# Patient Record
Sex: Female | Born: 1995
Health system: Southern US, Community
[De-identification: ages and names within clinical notes are randomized; demographics above are authoritative.]

## PROBLEM LIST (undated history)

## (undated) DIAGNOSIS — J302 Other seasonal allergic rhinitis: Secondary | ICD-10-CM

## (undated) DIAGNOSIS — L309 Dermatitis, unspecified: Secondary | ICD-10-CM

## (undated) HISTORY — DX: Dermatitis, unspecified: L30.9

## (undated) HISTORY — DX: Other seasonal allergic rhinitis: J30.2

---

## 2013-04-08 HISTORY — PX: CYST EXCISION: SHX5701

## 2017-03-13 DIAGNOSIS — B373 Candidiasis of vulva and vagina: Secondary | ICD-10-CM | POA: Diagnosis not present

## 2017-03-13 DIAGNOSIS — Z30011 Encounter for initial prescription of contraceptive pills: Secondary | ICD-10-CM | POA: Diagnosis not present

## 2017-05-06 DIAGNOSIS — L700 Acne vulgaris: Secondary | ICD-10-CM | POA: Diagnosis not present

## 2017-05-06 DIAGNOSIS — L818 Other specified disorders of pigmentation: Secondary | ICD-10-CM | POA: Diagnosis not present

## 2019-02-09 ENCOUNTER — Encounter: Payer: Self-pay | Admitting: Family

## 2019-02-09 ENCOUNTER — Ambulatory Visit (INDEPENDENT_AMBULATORY_CARE_PROVIDER_SITE_OTHER): Payer: BC Managed Care – PPO | Admitting: Family

## 2019-02-09 ENCOUNTER — Other Ambulatory Visit: Payer: Self-pay

## 2019-02-09 VITALS — BP 130/71 | HR 74 | Temp 97.7°F | Resp 16 | Ht 64.0 in | Wt 196.0 lb

## 2019-02-09 DIAGNOSIS — L309 Dermatitis, unspecified: Secondary | ICD-10-CM

## 2019-02-09 DIAGNOSIS — Z0000 Encounter for general adult medical examination without abnormal findings: Secondary | ICD-10-CM

## 2019-02-09 DIAGNOSIS — Z23 Encounter for immunization: Secondary | ICD-10-CM

## 2019-02-09 NOTE — Patient Instructions (Addendum)
Please complete lab work prior to leaving.  Preventive Care 21-25 Years Old, Female Preventive care refers to visits with your health care provider and lifestyle choices that can promote health and wellness. This includes:  A yearly physical exam. This may also be called an annual well check.  Regular dental visits and eye exams.  Immunizations.  Screening for certain conditions.  Healthy lifestyle choices, such as eating a healthy diet, getting regular exercise, not using drugs or products that contain nicotine and tobacco, and limiting alcohol use. What can I expect for my preventive care visit? Physical exam Your health care provider will check your:  Height and weight. This may be used to calculate body mass index (BMI), which tells if you are at a healthy weight.  Heart rate and blood pressure.  Skin for abnormal spots. Counseling Your health care provider may ask you questions about your:  Alcohol, tobacco, and drug use.  Emotional well-being.  Home and relationship well-being.  Sexual activity.  Eating habits.  Work and work environment.  Method of birth control.  Menstrual cycle.  Pregnancy history. What immunizations do I need?  Influenza (flu) vaccine  This is recommended every year. Tetanus, diphtheria, and pertussis (Tdap) vaccine  You may need a Td booster every 10 years. Varicella (chickenpox) vaccine  You may need this if you have not been vaccinated. Human papillomavirus (HPV) vaccine  If recommended by your health care provider, you may need three doses over 6 months. Measles, mumps, and rubella (MMR) vaccine  You may need at least one dose of MMR. You may also need a second dose. Meningococcal conjugate (MenACWY) vaccine  One dose is recommended if you are age 19-21 years and a first-year college student living in a residence hall, or if you have one of several medical conditions. You may also need additional booster doses. Pneumococcal  conjugate (PCV13) vaccine  You may need this if you have certain conditions and were not previously vaccinated. Pneumococcal polysaccharide (PPSV23) vaccine  You may need one or two doses if you smoke cigarettes or if you have certain conditions. Hepatitis A vaccine  You may need this if you have certain conditions or if you travel or work in places where you may be exposed to hepatitis A. Hepatitis B vaccine  You may need this if you have certain conditions or if you travel or work in places where you may be exposed to hepatitis B. Haemophilus influenzae type b (Hib) vaccine  You may need this if you have certain conditions. You may receive vaccines as individual doses or as more than one vaccine together in one shot (combination vaccines). Talk with your health care provider about the risks and benefits of combination vaccines. What tests do I need?  Blood tests  Lipid and cholesterol levels. These may be checked every 5 years starting at age 20.  Hepatitis C test.  Hepatitis B test. Screening  Diabetes screening. This is done by checking your blood sugar (glucose) after you have not eaten for a while (fasting).  Sexually transmitted disease (STD) testing.  BRCA-related cancer screening. This may be done if you have a family history of breast, ovarian, tubal, or peritoneal cancers.  Pelvic exam and Pap test. This may be done every 3 years starting at age 21. Starting at age 30, this may be done every 5 years if you have a Pap test in combination with an HPV test. Talk with your health care provider about your test results, treatment options,   treatment options, and if necessary, the need for more tests. Follow these instructions at home: Eating and drinking   Eat a diet that includes fresh fruits and vegetables, whole grains, lean protein, and low-fat dairy.  Take vitamin and mineral supplements as recommended by your health care provider.  Do not drink alcohol  if: ? Your health care provider tells you not to drink. ? You are pregnant, may be pregnant, or are planning to become pregnant.  If you drink alcohol: ? Limit how much you have to 0-1 drink a day. ? Be aware of how much alcohol is in your drink. In the [place] one drink equals one 12 oz bottle of beer (355 mL), one 5 oz glass of wine (148 mL), or one 1 oz glass of hard liquor (44 mL). Lifestyle  Take daily care of your teeth and gums.  Stay active. Exercise for at least 30 minutes on 5 or more days each week.  Do not use any products that contain nicotine or tobacco, such as cigarettes, e-cigarettes, and chewing tobacco. If you need help quitting, ask your health care provider.  If you are sexually active, practice safe sex. Use a condom or other form of birth control (contraception) in order to prevent pregnancy and STIs (sexually transmitted infections). If you plan to become pregnant, see your health care provider for a preconception visit. What's next?  Visit your health care provider once a year for a well check visit.  Ask your health care provider how often you should have your eyes and teeth checked.  Stay up to date on all vaccines. This information is not intended to replace advice given to you by your health care provider. Make sure you discuss any questions you have with your health care provider. Document Released: 05/21/2001 Document Revised: 12/04/2017 Document Reviewed: 12/04/2017 Elsevier Patient Education  2020 [employer].

## 2019-02-09 NOTE — Progress Notes (Signed)
Subjective:    Patient ID: Miranda Wallace, female    DOB: 06/23/1995, 23 y.o.   MRN: [id number]  HPI  Patient is a 23 yr old female who presents today to establish care.   Immunizations:  Unsure of last tetanus. Flu shot today Diet: healthy Exercise: 4 days a week, walks/jogs 1-2 miles, push ups/sit ups Pap Smear: 3 years ago.  Will return to GYN She reports that it was done at [facility]: due Vision: up to date  Reports that she has lost 24 pounds in the last 6 months   Review of Systems  Constitutional: Negative for unexpected weight change.  HENT: Negative for hearing loss and rhinorrhea.   Eyes: Negative for visual disturbance.  Respiratory: Negative for cough and shortness of breath.   Cardiovascular: Negative for chest pain.  Gastrointestinal: Negative for blood in stool, constipation and diarrhea.  Genitourinary: Negative for dysuria, frequency, hematuria and menstrual problem.  Musculoskeletal: Negative for arthralgias and myalgias.  Skin: Negative for rash (occasional eczema).  Neurological: Negative for headaches.  Hematological: Negative for adenopathy.  Psychiatric/Behavioral:       Denies depression/anxiety   Past Medical History:  Diagnosis Date  . Eczema   . Seasonal allergies      Social History   Socioeconomic History  . Marital status: Single    Spouse name: Not on file  . Number of children: Not on file  . Years of education: Not on file  . Highest education level: Not on file  Occupational History  . Occupation: unemployed  Social Needs  . Financial resource strain: Not hard at all  . Food insecurity    Worry: Never true    Inability: Never true  . Transportation needs    Medical: No    Non-medical: No  Tobacco Use  . Smoking status: Never Smoker  Substance and Sexual Activity  . Alcohol use: Yes    Alcohol/week: 2.0 standard drinks    Types: 2 Shots of liquor per week    Comment: once a month  . Drug use: Not Currently   . Sexual activity: Not Currently    Partners: Male  Lifestyle  . Physical activity    Days per week: 4 days    Minutes per session: 60 min  . Stress: Not on file  Relationships  . Social connections    Talks on phone: More than three times a week    Gets together: Once a week    Attends religious service: Never    Active member of club or organization: No    Attends meetings of clubs or organizations: Never    Relationship status: Not on file  . Intimate partner violence    Fear of current or ex partner: Not on file    Emotionally abused: Not on file    Physically abused: Not on file    Forced sexual activity: Not on file  Other Topics Concern  . 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 Has degree in psychology   Lives with dad, step-mom, younger sister   No pets   Enjoys reading, spending time with family and friend      Family History  Problem Relation Age of Onset  . Dementia Maternal Grandmother   . Cancer Paternal Grandmother   . Cancer Paternal Grandfather   . Diabetes Maternal Aunt     No Known Allergies  No current outpatient medications  on file prior to visit.   No current facility-administered medications on file prior to visit.     BP 130/71 (BP Location: Left Arm, Patient Position: Sitting, Cuff Size: Small)   Pulse 74   Temp 97.7 F (36.5 C) (Temporal)   Resp 16   Ht 5\' 4"  (1.626 m)   Wt 196 lb (88.9 kg)   SpO2 100%   BMI 33.64 kg/m       Objective:   Physical Exam Physical Exam  Constitutional: She is oriented to person, place, and time. She appears well-developed and well-nourished. No distress.  HENT:  Head: Normocephalic and atraumatic.  Right Ear: Tympanic membrane and ear canal normal.  Left Ear: Tympanic membrane and ear canal normal.  Mouth/Throat: Oropharynx is clear and moist.  Eyes: Pupils are equal, round, and reactive to light. No scleral icterus.  Neck: Normal  range of motion. No thyromegaly present.  Cardiovascular: Normal rate and regular rhythm.   No murmur heard. Pulmonary/Chest: Effort normal and breath sounds normal. No respiratory distress. He has no wheezes. She has no rales. She exhibits no tenderness.  Abdominal: Soft. Bowel sounds are normal. She exhibits no distension and no mass. There is no tenderness. There is no rebound and no guarding.  Musculoskeletal: She exhibits no edema.  Lymphadenopathy:    She has no cervical adenopathy.  Neurological: She is alert and oriented to person, place, and time. She has normal patellar reflexes. She exhibits normal muscle tone. Coordination normal.  Skin: Skin is warm and dry.  Psychiatric: She has a normal mood and affect. Her behavior is normal. Judgment and thought content normal.  Breast/pelvic: deferred to GYN       Assessment & Plan:   Preventative care- encouraged pt to keep up the good work with healthy diet, exercise and weight loss. She will schedule pap with her GYN, last pap was 2017. Vision/dental up to date. Tdap and flu shot today.        Assessment & Plan:

## 2019-02-17 DIAGNOSIS — S39012A Strain of muscle, fascia and tendon of lower back, initial encounter: Secondary | ICD-10-CM | POA: Diagnosis not present

## 2019-02-17 DIAGNOSIS — M545 Low back pain: Secondary | ICD-10-CM | POA: Diagnosis not present

## 2019-07-07 NOTE — Progress Notes (Signed)
Presents for pre-employment drug screen. Specimen collected using [employer] Chain of Custody form for [facility] account number [id number]. Specimen ID [id number]  AMD

## 2019-07-08 ENCOUNTER — Other Ambulatory Visit: Payer: Self-pay

## 2019-07-08 ENCOUNTER — Ambulatory Visit: Payer: Self-pay

## 2019-07-08 DIAGNOSIS — Z0289 Encounter for other administrative examinations: Secondary | ICD-10-CM

## 2019-07-08 LAB — POCT URINALYSIS DIPSTICK
Bilirubin, UA: NEGATIVE
Blood, UA: POSITIVE
Glucose, UA: NEGATIVE
Ketones, UA: POSITIVE
Leukocytes, UA: NEGATIVE
Nitrite, UA: NEGATIVE
Protein, UA: NEGATIVE
Spec Grav, UA: 1.02 (ref 1.010–1.025)
Urobilinogen, UA: 0.2 E.U./dL
pH, UA: 6.5 (ref 5.0–8.0)

## 2019-07-11 LAB — CMP12+LP+TP+TSH+6AC+CBC/D/PLT
ALT: 11 IU/L (ref 0–32)
AST: 14 IU/L (ref 0–40)
Albumin/Globulin Ratio: 1.4 (ref 1.2–2.2)
Albumin: 4.4 g/dL (ref 3.9–5.0)
Alkaline Phosphatase: 47 IU/L (ref 39–117)
BUN/Creatinine Ratio: 13 (ref 9–23)
BUN: 9 mg/dL (ref 6–20)
Basophils Absolute: 0 10*3/uL (ref 0.0–0.2)
Basos: 0 %
Bilirubin Total: 0.7 mg/dL (ref 0.0–1.2)
Calcium: 9.4 mg/dL (ref 8.7–10.2)
Chloride: 104 mmol/L (ref 96–106)
Chol/HDL Ratio: 2.3 ratio (ref 0.0–4.4)
Cholesterol, Total: 123 mg/dL (ref 100–199)
Creatinine, Ser: 0.68 mg/dL (ref 0.57–1.00)
EOS (ABSOLUTE): 0 10*3/uL (ref 0.0–0.4)
Eos: 1 %
Estimated CHD Risk: <0.5 times avg. (ref 0.0–1.0)
Free Thyroxine Index: 2.2 (ref 1.2–4.9)
GFR calc Af Amer: 141 mL/min/{1.73_m2} (ref >59)
GFR calc non Af Amer: 122 mL/min/{1.73_m2} (ref >59)
GGT: 8 IU/L (ref 0–60)
Globulin, Total: 3.1 g/dL (ref 1.5–4.5)
Glucose: 92 mg/dL (ref 65–99)
HDL: 54 mg/dL (ref >39)
Hematocrit: 36 % (ref 34.0–46.6)
Hemoglobin: 12.3 g/dL (ref 11.1–15.9)
Immature Grans (Abs): 0 10*3/uL (ref 0.0–0.1)
Immature Granulocytes: 0 %
Iron: 74 ug/dL (ref 27–159)
LDH: 136 IU/L (ref 119–226)
LDL Chol Calc (NIH): 57 mg/dL (ref 0–99)
Lymphocytes Absolute: 1.9 10*3/uL (ref 0.7–3.1)
Lymphs: 43 %
MCH: 30.1 pg (ref 26.6–33.0)
MCHC: 34.2 g/dL (ref 31.5–35.7)
MCV: 88 fL (ref 79–97)
Monocytes Absolute: 0.3 10*3/uL (ref 0.1–0.9)
Monocytes: 8 %
Neutrophils Absolute: 2.1 10*3/uL (ref 1.4–7.0)
Neutrophils: 48 %
Phosphorus: 4 mg/dL (ref 3.0–4.3)
Platelets: 330 10*3/uL (ref 150–450)
Potassium: 4.2 mmol/L (ref 3.5–5.2)
RBC: 4.09 x10E6/uL (ref 3.77–5.28)
RDW: 12 % (ref 11.7–15.4)
Sodium: 141 mmol/L (ref 134–144)
T3 Uptake Ratio: 28 % (ref 24–39)
T4, Total: 7.9 ug/dL (ref 4.5–12.0)
TSH: 1.32 u[IU]/mL (ref 0.450–4.500)
Total Protein: 7.5 g/dL (ref 6.0–8.5)
Triglycerides: 50 mg/dL (ref 0–149)
Uric Acid: 5.7 mg/dL (ref 2.6–6.2)
VLDL Cholesterol Cal: 12 mg/dL (ref 5–40)
WBC: 4.4 10*3/uL (ref 3.4–10.8)

## 2019-07-11 LAB — QUANTIFERON-TB GOLD PLUS
QuantiFERON Mitogen Value: >10 IU/mL
QuantiFERON Nil Value: 0.03 IU/mL
QuantiFERON TB1 Ag Value: 0.02 IU/mL
QuantiFERON TB2 Ag Value: 0.05 IU/mL
QuantiFERON-TB Gold Plus: NEGATIVE

## 2019-07-11 LAB — HEPATITIS B SURFACE ANTIBODY,QUALITATIVE: Hep B Surface Ab, Qual: NONREACTIVE

## 2019-07-20 ENCOUNTER — Ambulatory Visit: Payer: BC Managed Care – PPO

## 2019-07-22 ENCOUNTER — Ambulatory Visit: Payer: Self-pay | Admitting: Physician Assistant

## 2019-07-22 ENCOUNTER — Other Ambulatory Visit: Payer: Self-pay

## 2019-07-22 ENCOUNTER — Encounter: Payer: Self-pay | Admitting: Physician Assistant

## 2019-07-22 VITALS — BP 129/71 | HR 80 | Temp 99.0°F | Resp 16 | Ht 63.0 in | Wt 205.0 lb

## 2019-07-22 DIAGNOSIS — Z021 Encounter for pre-employment examination: Secondary | ICD-10-CM

## 2019-07-22 NOTE — Progress Notes (Signed)
   Subjective: Employment physical    Patient ID: Miranda Wallace, female    DOB: 1996-02-21, 24 y.o.   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  Patient reports no complaints or concerns.   Review of Systems    Negative Objective:   Physical Exam No acute distress.  Overweight for height.  HEENT is unremarkable except for decreased visual acuity corrected by lenses.  Neck is supple without adenopathy or bruits.  Lungs are clear to auscultation.  Heart regular rate and rhythm.  Abdomen with negative HSM, normoactive bowel sounds, soft nontender palpation.  No obvious deformity to the upper or lower extremities.  Patient is full neck range of motion of the lower and upper extremities.  No obvious deformity to cervical lumbar spine.  Patient has full neck range of motion cervical lumbar spine.  Cranial nerves II through XII are grossly intact.      Assessment & Plan: Well exam  Patient is qualified to a training program.

## 2019-07-22 NOTE — Progress Notes (Signed)
Presents today to complete employment physical for [employer].  AMD

## 2020-02-11 ENCOUNTER — Encounter: Payer: BC Managed Care – PPO | Admitting: Family

## 2020-06-27 DIAGNOSIS — H5213 Myopia, bilateral: Secondary | ICD-10-CM | POA: Diagnosis not present

## 2020-06-27 DIAGNOSIS — H52209 Unspecified astigmatism, unspecified eye: Secondary | ICD-10-CM | POA: Diagnosis not present

## 2020-08-08 ENCOUNTER — Encounter: Payer: Self-pay | Admitting: Family

## 2020-08-14 ENCOUNTER — Ambulatory Visit: Payer: 59 | Attending: Internal Medicine

## 2020-08-14 ENCOUNTER — Ambulatory Visit (INDEPENDENT_AMBULATORY_CARE_PROVIDER_SITE_OTHER): Payer: 59 | Admitting: Family

## 2020-08-14 ENCOUNTER — Other Ambulatory Visit: Payer: Self-pay

## 2020-08-14 ENCOUNTER — Encounter: Payer: Self-pay | Admitting: Family

## 2020-08-14 VITALS — BP 124/70 | HR 86 | Temp 98.6°F | Resp 16 | Ht 64.5 in | Wt 212.0 lb

## 2020-08-14 DIAGNOSIS — Z0000 Encounter for general adult medical examination without abnormal findings: Secondary | ICD-10-CM | POA: Diagnosis not present

## 2020-08-14 DIAGNOSIS — R635 Abnormal weight gain: Secondary | ICD-10-CM

## 2020-08-14 DIAGNOSIS — Z23 Encounter for immunization: Secondary | ICD-10-CM

## 2020-08-14 LAB — TSH: TSH: 1.28 u[IU]/mL (ref 0.35–4.50)

## 2020-08-14 NOTE — Patient Instructions (Signed)
Please schedule a pap smear with GYN and routine dental exam.

## 2020-08-14 NOTE — Progress Notes (Signed)
Subjective:   By signing my name below, I, Shehryar Baig, attest that this documentation has been prepared under the direction and in the presence of Sandford Craze NP. 08/14/2020   Patient ID: Miranda Wallace, female    DOB: 08-26-1995, 25 y.o.   MRN: [id number]  No chief complaint on file.   HPI Patient is in today for a comprehensive physical exam. She is doing well at this time. She denies having cough, cold symptoms, burning, frequency, blood in urine, swollen glands, depression, anxiety diarrhea, and constipation at this time.  Immunizations: She has not gotten a flu. She has received the Covid 19 vaccinations but has not received her booster vaccines.  Diet: Her diet is well managed at this time. She drinks soft drinks at this time. She has gained weight since her last visit. Wt Readings from Last 3 Encounters:  08/14/20 212 lb (96.2 kg)  07/22/19 205 lb (93 kg)  02/09/19 196 lb (88.9 kg)   Exercise: She participates in exercise by occasionally doing jiu jitsu. Colonoscopy: Not yet completed. Dexa: No yet completed. Pap Smear: She is scheduling an appointment for a pap smear today. Mammogram: Not yet completed. Dental: She is not UTD on dental exams. Vision: She is UTD on vision exams.   Past Medical History:  Diagnosis Date  . Eczema   . Seasonal allergies     Past Surgical History:  Procedure Laterality Date  . CYST EXCISION Left 2015   left wrist    Family History  Problem Relation Age of Onset  . Dementia Maternal Grandmother   . Cancer Paternal Grandmother   . Cancer Paternal Grandfather   . Diabetes Maternal Aunt     Social History   Socioeconomic History  . Marital status: Single    Spouse name: Not on file  . Number of children: Not on file  . Years of education: Not on file  . Highest education level: Not on file  Occupational History  . Occupation: unemployed  Tobacco Use  . Smoking status: Never Smoker  . Smokeless tobacco: Never  Used  Vaping Use  . Vaping Use: Never used  Substance and Sexual Activity  . Alcohol use: Yes    Alcohol/week: 2.0 standard drinks    Types: 2 Shots of liquor per week    Comment: once a month  . Drug use: Not Currently  . Sexual activity: Not Currently    Partners: Male  Other Topics Concern  . 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 Has degree in psychology   Lives with dad, step-mom, younger sister   No pets   Enjoys reading, spending time with family and friend   Social Determinants of Corporate investment banker Strain: Not on file  Food Insecurity: Not on file  Transportation Needs: Not on file  Physical Activity: Not on file  Stress: Not on file  Social Connections: Not on file  Intimate Partner Violence: Not on file    Outpatient Medications Prior to Visit  Medication Sig Dispense Refill  . diphenhydrAMINE (BENADRYL ALLERGY) 25 MG tablet Take 25 mg by mouth every 6 (six) hours as needed.     No facility-administered medications prior to visit.    No Known Allergies  Review of Systems  HENT: Negative for congestion.   Respiratory: Negative for cough.   Gastrointestinal: Negative for constipation and diarrhea.  Genitourinary: Negative for dysuria, frequency and hematuria.  Neurological:  Negative for headaches.  Psychiatric/Behavioral: Negative for depression. The patient is not nervous/anxious.        Objective:    Physical Exam Constitutional:      Appearance: Normal appearance.  HENT:     Head: Normocephalic and atraumatic.     Right Ear: Tympanic membrane and external ear normal.     Left Ear: Tympanic membrane and external ear normal.  Eyes:     Extraocular Movements: Extraocular movements intact.     Pupils: Pupils are equal, round, and reactive to light.     Comments: No nystagmus.  Cardiovascular:     Rate and Rhythm: Normal rate and regular rhythm.     Pulses: Normal pulses.      Heart sounds: Normal heart sounds. No murmur heard. No friction rub. No gallop.   Pulmonary:     Effort: Pulmonary effort is normal. No respiratory distress.     Breath sounds: Normal breath sounds. No stridor. No wheezing, rhonchi or rales.  Abdominal:     General: Bowel sounds are normal. There is no distension.     Palpations: Abdomen is soft. There is no mass.     Tenderness: There is no abdominal tenderness. There is no guarding or rebound.  Musculoskeletal:     Comments: 5/5 strength in both upper and lower extremities.  Lymphadenopathy:     Cervical: No cervical adenopathy.  Skin:    General: Skin is warm and dry.  Neurological:     Mental Status: She is alert and oriented to person, place, and time.     Comments: Patellar reflexes are normal.  Psychiatric:        Behavior: Behavior normal.     There were no vitals taken for this visit. Wt Readings from Last 3 Encounters:  07/22/19 205 lb (93 kg)  02/09/19 196 lb (88.9 kg)    Diabetic Foot Exam - Simple   No data filed    Lab Results  Component Value Date   WBC 4.4 07/08/2019   HGB 12.3 07/08/2019   HCT 36.0 07/08/2019   PLT 330 07/08/2019   GLUCOSE 92 07/08/2019   CHOL 123 07/08/2019   TRIG 50 07/08/2019   HDL 54 07/08/2019   LDLCALC 57 07/08/2019   ALT 11 07/08/2019   AST 14 07/08/2019   NA 141 07/08/2019   K 4.2 07/08/2019   CL 104 07/08/2019   CREATININE 0.68 07/08/2019   BUN 9 07/08/2019   TSH 1.320 07/08/2019    Lab Results  Component Value Date   TSH 1.320 07/08/2019   Lab Results  Component Value Date   WBC 4.4 07/08/2019   HGB 12.3 07/08/2019   HCT 36.0 07/08/2019   MCV 88 07/08/2019   PLT 330 07/08/2019   Lab Results  Component Value Date   NA 141 07/08/2019   K 4.2 07/08/2019   GLUCOSE 92 07/08/2019   BUN 9 07/08/2019   CREATININE 0.68 07/08/2019   BILITOT 0.7 07/08/2019   ALKPHOS 47 07/08/2019   AST 14 07/08/2019   ALT 11 07/08/2019   PROT 7.5 07/08/2019   ALBUMIN 4.4  07/08/2019   CALCIUM 9.4 07/08/2019   Lab Results  Component Value Date   CHOL 123 07/08/2019   Lab Results  Component Value Date   HDL 54 07/08/2019   Lab Results  Component Value Date   LDLCALC 57 07/08/2019   Lab Results  Component Value Date   TRIG 50 07/08/2019   Lab Results  Component Value  Date   CHOLHDL 2.3 07/08/2019   No results found for: HGBA1C     Assessment & Plan:   Problem List Items Addressed This Visit   None      No orders of the defined types were placed in this encounter.   I, Sandford Craze NP, personally preformed the services described in this documentation.  All medical record entries made by the scribe were at my direction and in my presence.  I have reviewed the chart and discharge instructions (if applicable) and agree that the record reflects my personal performance and is accurate and complete. 08/14/2020   I,Shehryar Baig,acting as a scribe for Lemont Fillers, NP.,have documented all relevant documentation on the behalf of Lemont Fillers, NP,as directed by  Lemont Fillers, NP while in the presence of Lemont Fillers, NP.   Shehryar [employer]

## 2020-08-14 NOTE — Progress Notes (Signed)
   Covid-19 Vaccination Clinic  Name:  Miranda Wallace    MRN: [id number] DOB: 07-20-1995  08/14/2020  Miranda Wallace was observed post Covid-19 immunization for 15 minutes without incident. She was provided with Vaccine Information Sheet and instruction to access the V-Safe system.   Miranda Wallace was instructed to call 911 with any severe reactions post vaccine: Marland Kitchen Difficulty breathing  . Swelling of face and throat  . A fast heartbeat  . A bad rash all over body  . Dizziness and weakness   Immunizations Administered    Name Date Dose VIS Date Route   Moderna Covid-19 Booster Vaccine 08/14/2020 11:05 AM 0.25 mL 01/26/2020 Intramuscular   Manufacturer: Moderna   Lot: [id number]   NDC: 89381-017-51

## 2020-08-14 NOTE — Assessment & Plan Note (Signed)
Discussed healthy diet, exercise and weight loss. She is advised to get her covid booster and schedule her pap smear with her GYN.

## 2020-08-18 ENCOUNTER — Other Ambulatory Visit (HOSPITAL_BASED_OUTPATIENT_CLINIC_OR_DEPARTMENT_OTHER): Payer: Self-pay

## 2020-08-18 MED ORDER — MODERNA COVID-19 VACCINE 100 MCG/0.5ML IM SUSP
INTRAMUSCULAR | 0 refills | Status: DC
  Filled 2020-08-18: qty 0.25, 1d supply, fill #0

## 2020-08-22 ENCOUNTER — Other Ambulatory Visit (HOSPITAL_COMMUNITY)
Admission: RE | Admit: 2020-08-22 | Discharge: 2020-08-22 | Disposition: A | Discharge status: Home / Self Care | Payer: 59 | Source: Ambulatory Visit | Attending: Obstetrics and Gynecology | Admitting: Obstetrics and Gynecology

## 2020-08-22 ENCOUNTER — Encounter: Payer: Self-pay | Admitting: Obstetrics and Gynecology

## 2020-08-22 ENCOUNTER — Other Ambulatory Visit: Payer: Self-pay

## 2020-08-22 ENCOUNTER — Ambulatory Visit (INDEPENDENT_AMBULATORY_CARE_PROVIDER_SITE_OTHER): Payer: 59 | Admitting: Obstetrics and Gynecology

## 2020-08-22 VITALS — BP 107/60 | HR 79 | Ht 64.5 in | Wt 213.0 lb

## 2020-08-22 DIAGNOSIS — Z01419 Encounter for gynecological examination (general) (routine) without abnormal findings: Secondary | ICD-10-CM | POA: Insufficient documentation

## 2020-08-22 DIAGNOSIS — Z23 Encounter for immunization: Secondary | ICD-10-CM

## 2020-08-22 MED ORDER — NORGESTIMATE-ETH ESTRADIOL 0.25-35 MG-MCG PO TABS: 1.0000 | ORAL_TABLET | Freq: Every day | ORAL | 11 refills | Status: DC

## 2020-08-22 NOTE — Patient Instructions (Signed)
You will get a survey in the mail about your visit today. Please fill it out, it helps [place] improve!        HPV Vaccine Information   HPV (human papillomavirus) is a common virus that spreads easily from person to person through skin-to-skin or sexual contact. There are many types of HPV viruses. They can cause warts in the genitals (genital or mucosal HPV), or on the hands or feet (cutaneous or nonmucosal HPV). Some genital HPV types are considered high-risk and may cause cancer. Your child can get a vaccination to help prevent certain HPV infections that can cause cancer as well as those types that cause genital and anal warts. The vaccine is safe and effective. It is recommended for boys and girls at about 75-81 years of age. Getting the vaccine at this age (before he or she is sexually active) gives your child the best protection from HPV infection through adulthood. How can HPV affect my child? HPV infection can cause:  Genital warts.  Mouth or throat cancer.  Anal cancer.  Cervical, vulvar, or vaginal cancer.  Penile cancer. During pregnancy, HPV infection can be passed to the baby. This infection can cause warts to develop in a baby's throat and windpipe. What actions can I take to lower my child's risk for HPV? To lower your child's risk for genital HPV infection, have him or her get the HPV vaccine before becoming sexually active. The best time for vaccination is between ages 1 and 21, though it can be given to children as young as 30 years old. If your child gets the first dose before age 35, the vaccination can be given as 2 shots, 6-12 months apart. In some situations, 3 doses are needed.  If your child starts the vaccine before age 67 but does not have a second dose within 6-12 months after the first dose, he or she will need 3 doses to complete the vaccination. When your child has the first dose, it is important to make an appointment for the next shot and keep the  appointment.  Teens who are not vaccinated before age 17 will need 3 doses, within six months of the first dose.  If your child has a weak immune system, he or she may need 3 doses. Young adults can also get the vaccination, even if they are already sexually active and even if they have already been infected with HPV. The vaccination can still help prevent the types of cancer-causing HPV that a person has not been infected with. What are the risks and benefits of the HPV vaccine? Benefits The main benefit of getting vaccinated is to prevent certain cancers, including:  Cervical, vulvar, and vaginal cancer in females.  Penile cancer in males.  Oral and anal cancer in both males and females. The risk of these cancers is lower if your child gets vaccinated before he or she becomes sexually active. The vaccine also prevents genital warts caused by HPV. Risks The risks, although low, include side effects or reactions to the vaccine. Very few reactions have been reported, but they can include:  Soreness, redness, or swelling at the injection site.  Dizziness or headache.  Fever. Who should not get the HPV vaccine or should wait to get it? Some children should not get the HPV vaccine or should wait. Discuss the risks and benefits of the vaccine with your child's health care provider if your child:  Has had a severe allergic reaction to other vaccinations.  Is  allergic to yeast.  Has a fever.  Has had a recent illness.  Is pregnant or may be pregnant. Where to find more information  Centers for Disease Control and Prevention: https://www.mckee-gibson.com/  American Academy of Pediatrics: healthychildren.org Summary  HPV (human papillomavirus) is a common virus that spreads from person to person through skin-to-skin or sexual contact. It can spread during vaginal, anal, or oral sex.  Your child can get a vaccination to prevent HPV infection and cancer. It is best to get the vaccination before  becoming sexually active.  The HPV vaccine can protect your child from genital warts and certain types of cancer, including cancer of the cervix, throat, mouth, vulva, vagina, anus, and penis.  The HPV vaccine is both safe and effective.  The best time for boys and girls to get the vaccination is when they are between ages 44 and 44. This information is not intended to replace advice given to you by your health care provider. Make sure you discuss any questions you have with your health care provider. Document Revised: 11/30/2019 Document Reviewed: 11/09/2019 Elsevier Patient Education  2021 [employer].

## 2020-08-22 NOTE — Progress Notes (Signed)
GYNECOLOGY ANNUAL PREVENTATIVE CARE ENCOUNTER NOTE  History:     Miranda Wallace is a 25 y.o. G0P0000 female here for a routine annual gynecologic exam.  Current complaints: None. Denies abnormal vaginal bleeding, discharge, pelvic pain, problems with intercourse or other gynecologic concerns. Occasional pain with insertion. Uses condoms and lubrication.   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 Contraception: none- interested in starting Last Pap: Unknown. Results were: normal with negative HPV Last mammogram: NA.   Obstetric History OB History  Gravida Para Term Preterm AB Living  0 0 0 0 0 0  SAB IAB Ectopic Multiple Live Births  0 0 0 0 0    Past Medical History:  Diagnosis Date  . Eczema   . Seasonal allergies     Past Surgical History:  Procedure Laterality Date  . CYST EXCISION Left 2015   left wrist    Current Outpatient Medications on File Prior to Visit  Medication Sig Dispense Refill  . COVID-19 mRNA vaccine, Moderna, (MODERNA COVID-19 VACCINE) 100 MCG/0.5ML injection Inject into the muscle. (Patient not taking: Reported on 08/22/2020) 0.25 mL 0  . diphenhydrAMINE (BENADRYL) 25 MG tablet Take 25 mg by mouth every 6 (six) hours as needed. (Patient not taking: Reported on 08/22/2020)     No current facility-administered medications on file prior to visit.    No Known Allergies  Social History:  reports that she has never smoked. She has never used smokeless tobacco. She reports current alcohol use of about 2.0 standard drinks of alcohol per week. She reports previous drug use.  Family History  Problem Relation Age of Onset  . Dementia Maternal Grandmother   . Cancer Paternal Grandmother   . Cancer Paternal Grandfather   . Diabetes Maternal Aunt     The following portions of the patient's history were reviewed and updated as appropriate:  allergies, current medications, past family history, past medical history, past social history, past surgical history and problem list.  Review of Systems Pertinent items noted in HPI and remainder of comprehensive ROS otherwise negative.  Physical Exam:  BP 107/60   Pulse 79   Ht 5' 4.5" (1.638 m)   Wt 213 lb (96.6 kg)   LMP 08/02/2020   BMI 36.00 kg/m  CONSTITUTIONAL: Well-developed, well-nourished female in no acute distress.  HENT:  Normocephalic, atraumatic, External right and left ear normal.  EYES: Conjunctivae and EOM are normal. Pupils are equal, round, and reactive to light. No scleral icterus.  NECK: Normal range of motion, supple, no masses.  Normal thyroid.  SKIN: Skin is warm and dry. No rash noted. Not diaphoretic. No erythema. No pallor. MUSCULOSKELETAL: Normal range of motion. No tenderness.  No cyanosis, clubbing, or edema. NEUROLOGIC: Alert and oriented to person, place, and time. Normal reflexes, muscle tone coordination.  PSYCHIATRIC: Normal mood and affect. Normal behavior. Normal judgment and thought content. CARDIOVASCULAR: Normal heart rate noted, regular rhythm RESPIRATORY: Clear to auscultation bilaterally. Effort and breath sounds normal, no problems with respiration noted. BREASTS: Symmetric in size. No masses, tenderness, skin changes, nipple drainage, or lymphadenopathy bilaterally. Performed in the presence of a chaperone. ABDOMEN: Soft, no distention noted.  No tenderness, rebound or guarding.  PELVIC: Normal appearing external genitalia and urethral meatus; normal appearing vaginal mucosa and cervix.  No abnormal discharge noted.  Pap smear obtained.  Normal uterine size, no other palpable masses, no uterine or adnexal tenderness.  Performed in the presence of a chaperone.   Assessment and Plan:   1. Women's annual routine gynecological examination  - Cytology - PAP( [place]) - RPR - HIV antibody (with reflex) - Hepatitis C Antibody - HPV  vaccine given today   Will follow up results of pap smear and manage accordingly. Routine preventative health maintenance measures emphasized. Please refer to After Visit Summary for other counseling recommendations.    Jun Rightmyer, Harolyn Rutherford, NP [facility] [employer], [facility]

## 2020-08-23 LAB — HIV ANTIBODY (ROUTINE TESTING W REFLEX): HIV Screen 4th Generation wRfx: NONREACTIVE

## 2020-08-23 LAB — HEPATITIS C ANTIBODY: Hep C Virus Ab: <0.1 s/co ratio (ref 0.0–0.9)

## 2020-08-23 LAB — RPR: RPR Ser Ql: NONREACTIVE

## 2020-08-25 LAB — CYTOLOGY - PAP
Adequacy: ABSENT
Chlamydia: NEGATIVE
Comment: NEGATIVE
Comment: NORMAL
Diagnosis: NEGATIVE
Neisseria Gonorrhea: NEGATIVE

## 2020-08-29 ENCOUNTER — Ambulatory Visit
Admission: RE | Admit: 2020-08-29 | Discharge: 2020-08-29 | Disposition: A | Discharge status: Home / Self Care | Payer: 59 | Source: Ambulatory Visit | Attending: Nurse Practitioner | Admitting: Nurse Practitioner

## 2020-08-29 ENCOUNTER — Ambulatory Visit: Payer: Self-pay | Admitting: Nurse Practitioner

## 2020-08-29 ENCOUNTER — Other Ambulatory Visit: Payer: Self-pay

## 2020-08-29 ENCOUNTER — Encounter: Payer: Self-pay | Admitting: Nurse Practitioner

## 2020-08-29 VITALS — BP 131/84 | HR 82 | Temp 99.6°F | Resp 16

## 2020-08-29 DIAGNOSIS — S60112A Contusion of left thumb with damage to nail, initial encounter: Secondary | ICD-10-CM

## 2020-08-29 DIAGNOSIS — S6701XA Crushing injury of right thumb, initial encounter: Secondary | ICD-10-CM | POA: Insufficient documentation

## 2020-08-29 DIAGNOSIS — S6991XA Unspecified injury of right wrist, hand and finger(s), initial encounter: Secondary | ICD-10-CM | POA: Diagnosis not present

## 2020-08-29 DIAGNOSIS — W230XXA Caught, crushed, jammed, or pinched between moving objects, initial encounter: Secondary | ICD-10-CM | POA: Diagnosis not present

## 2020-08-29 NOTE — Patient Instructions (Signed)
Elevate the area, apply ice, clean the area twice a day with soap and water, apply the antibiotic ointment and dressing. Return in 2 days for recheck. Return sooner for any problems.    Nail Bed Injury The nail bed is the soft tissue under a fingernail or toenail. The nail bed can be injured in different ways. You may:  Get bruising or bleeding under the nail.  Get cuts in the nail or nail bed.  Lose all or part of the nail. After your nail is hurt or torn off, it can take many months to grow again. The nail may not grow back normally. What are the causes? This condition is usually caused by crushing, pinching, cutting, or tearing injuries of the fingertip or toe. These injuries might happen when a finger or toe gets:  Caught in a door.  Hit by a hammer.  Damaged in accidents while you are using power tools or machinery. What are the signs or symptoms? Symptoms vary depending on the type of injury. Symptoms may include:  Pain in the injured area.  Bleeding.  Swelling.  A change in color of the area.  Collection of blood under the nail (hematoma).  Damage to the nail, such as: ? A deformed or split nail. ? A loose nail that is not stuck to the nail bed. ? Loss of all or part of the nail. How is this treated? Treatment may depend on the type of injury. Some injuries may not need any treatment other than keeping the area clean and free of infection. Treatment may include:  Draining blood from under the nail. This can be done by making a small hole in the nail.  Removing all or part of your nail. This might be done in order to stitch (suture) any cut in the nail bed.  Stitching a torn-off nail back in place.  Putting bandages (dressings) or splints on the area.  Taking medicines, such as: ? Antibiotic medicine to help prevent infection. ? Pain medicine.  Having a tetanus shot. This may be needed if you have not had a tetanus shot in the last 10 years. Follow these  instructions at home: Managing pain, stiffness, and swelling  Raise (elevate) the injured area above the level of your heart while you are sitting or lying down.  Keep your injury protected with bandages or splints as told by your doctor.  For an injured toenail: ? Try not to walk on your injured leg. ? Wear an open-toed shoe when you walk. ? Try not to let your leg hang down (dangle) when you are sitting or lying down. Wound care Follow any wound care instructions given by your doctor. Make sure you:  Keep the injured area clean.  Keep any bandages clean and dry.  Wash your hands with soap and water for at least 20 seconds before and after you change any bandage. If you cannot use soap and water, use hand sanitizer.  Change or take off the bandage only as told by your doctor.  Leave any stitches in place. These may need to stay in place for 2 weeks.  Check the injured area every day for signs of infection. Check for: ? More redness, swelling, or pain. ? More fluid or blood. ? Warmth. ? Pus or a bad smell. General instructions  Take over-the-counter and prescription medicines only as told by your doctor.  If you were prescribed an antibiotic medicine, use it as told by your doctor. Do not stop  using the antibiotic even if you start to feel better.  Ask your doctor if the medicine prescribed to you requires you to avoid driving or using machinery.  Keep all follow-up visits as told by your doctor. This is important. Contact a doctor if:  Medicine does not help your pain.  You have any of these problems in the injured area: ? More redness. ? More pain or swelling. ? More fluid or blood coming from the area. ? The area feels warm to the touch. ? Pus or a bad smell coming from the area.  You have a fever and your symptoms get worse. Get help right away if:  You lose feeling (have numbness) in your finger or toe.  Your finger or toe turns blue. Summary  The nail  bed is the soft tissue under a fingernail or toenail.  Sometimes, after a nail or nail bed is injured, the nail may not grow back normally.  Raise (elevate) the injured area above the level of your heart while you are sitting or lying down.  Keep any bandages clean and dry. Change or take them off only as told by your doctor.  Take over-the-counter and prescription medicines only as told by your doctor. This information is not intended to replace advice given to you by your health care provider. Make sure you discuss any questions you have with your health care provider. Document Revised: 01/08/2019 Document Reviewed: 01/08/2019 Elsevier Patient Education  2021 Else[employer] Inc.  Subungual Hematoma A subungual hematoma is a collection of blood under a fingernail or toenail. It can cause pain and a blue area under the nail. What are the causes? This condition is caused by an injury to a finger or toe that breaks a blood vessel under the nail. It can result from:  A hard, direct hit to a finger or toe (crush injury).  Pressure being put on a finger or toe over and over again, such as pressure on a toe from running. What are the signs or symptoms?  A blue or dark blue color under the nail.  Pain or throbbing in the injured area.   How is this treated? Treatment is often not needed for this condition. The pain often goes away in a few days, and the dark color under the nail will go away as the nail grows. If treatment is needed, your doctor may:  Do a procedure to drain the blood from under the nail. This may be done if you have a lot of pain or if a lot of blood collects under the nail.  Remove the nail. This may be done if there is a cut under the nail that needs stitches (sutures). Follow these instructions at home: Managing pain, stiffness, and swelling  If told, put ice on the area. ? Put ice in a plastic bag. ? Place a towel between your skin and the bag. ? Leave the ice on for  20 minutes, 2-3 times a day.  Raise (elevate) the injured finger or toe above the level of your heart while you are sitting or lying down. Doing this will help with pain and swelling.   Injury care  Follow instructions from your doctor about how to take care of your injury. Make sure you: ? Change any bandage (dressing) as told by your doctor. ? Wash your hands with soap and water before you change your bandage. If you cannot use soap and water, use hand sanitizer. ? Leave stitches (sutures) in place.  You may have these if your doctor fixed a cut under the nail. The stitches may need to stay in place for 2 weeks or longer.  If part of your nail falls off, gently trim the rest of the nail. General instructions  Take over-the-counter and prescription medicines only as told by your doctor.  Return to your normal activities as told by your doctor. Ask your doctor what activities are safe for you.  Keep all follow-up visits as told by your doctor. This is important. Contact a doctor if you have:  Pain that is not helped by medicine.  A fever.  Redness, swelling, or pain around your nail. Get help right away if you have:  Fluid, blood, or pus coming from your nail. Summary  A subungual hematoma is a collection of blood under a fingernail or toenail.  It can cause pain and a blue area under the nail.  Treatment is often not needed for this condition.  Raise the injured finger or toe above the level of your heart while you are sitting or lying down. This information is not intended to replace advice given to you by your health care provider. Make sure you discuss any questions you have with your health care provider. Document Revised: 08/28/2017 Document Reviewed: 08/28/2017 Elsevier Patient Education  2021 Elsevier Inc.  Nail Bed Injury The nail bed is the soft tissue under a fingernail or toenail. The nail bed can be injured in different ways. You may:  Get bruising or bleeding  under the nail.  Get cuts in the nail or nail bed.  Lose all or part of the nail. After your nail is hurt or torn off, it can take many months to grow again. The nail may not grow back normally. What are the causes? This condition is usually caused by crushing, pinching, cutting, or tearing injuries of the fingertip or toe. These injuries might happen when a finger or toe gets:  Caught in a door.  Hit by a hammer.  Damaged in accidents while you are using power tools or machinery. What are the signs or symptoms? Symptoms vary depending on the type of injury. Symptoms may include:  Pain in the injured area.  Bleeding.  Swelling.  A change in color of the area.  Collection of blood under the nail (hematoma).  Damage to the nail, such as: ? A deformed or split nail. ? A loose nail that is not stuck to the nail bed. ? Loss of all or part of the nail. How is this treated? Treatment may depend on the type of injury. Some injuries may not need any treatment other than keeping the area clean and free of infection. Treatment may include:  Draining blood from under the nail. This can be done by making a small hole in the nail.  Removing all or part of your nail. This might be done in order to stitch (suture) any cut in the nail bed.  Stitching a torn-off nail back in place.  Putting bandages (dressings) or splints on the area.  Taking medicines, such as: ? Antibiotic medicine to help prevent infection. ? Pain medicine.  Having a tetanus shot. This may be needed if you have not had a tetanus shot in the last 10 years. Follow these instructions at home: Managing pain, stiffness, and swelling  Raise (elevate) the injured area above the level of your heart while you are sitting or lying down.  Keep your injury protected with bandages or splints as  told by your doctor.  For an injured toenail: ? Try not to walk on your injured leg. ? Wear an open-toed shoe when you  walk. ? Try not to let your leg hang down (dangle) when you are sitting or lying down. Wound care Follow any wound care instructions given by your doctor. Make sure you:  Keep the injured area clean.  Keep any bandages clean and dry.  Wash your hands with soap and water for at least 20 seconds before and after you change any bandage. If you cannot use soap and water, use hand sanitizer.  Change or take off the bandage only as told by your doctor.  Leave any stitches in place. These may need to stay in place for 2 weeks.  Check the injured area every day for signs of infection. Check for: ? More redness, swelling, or pain. ? More fluid or blood. ? Warmth. ? Pus or a bad smell. General instructions  Take over-the-counter and prescription medicines only as told by your doctor.  If you were prescribed an antibiotic medicine, use it as told by your doctor. Do not stop using the antibiotic even if you start to feel better.  Ask your doctor if the medicine prescribed to you requires you to avoid driving or using machinery.  Keep all follow-up visits as told by your doctor. This is important. Contact a doctor if:  Medicine does not help your pain.  You have any of these problems in the injured area: ? More redness. ? More pain or swelling. ? More fluid or blood coming from the area. ? The area feels warm to the touch. ? Pus or a bad smell coming from the area.  You have a fever and your symptoms get worse. Get help right away if:  You lose feeling (have numbness) in your finger or toe.  Your finger or toe turns blue. Summary  The nail bed is the soft tissue under a fingernail or toenail.  Sometimes, after a nail or nail bed is injured, the nail may not grow back normally.  Raise (elevate) the injured area above the level of your heart while you are sitting or lying down.  Keep any bandages clean and dry. Change or take them off only as told by your doctor.  Take  over-the-counter and prescription medicines only as told by your doctor. This information is not intended to replace advice given to you by your health care provider. Make sure you discuss any questions you have with your health care provider. Document Revised: 01/08/2019 Document Reviewed: 01/08/2019 Elsevier Patient Education  2021 Elsevier Inc.  How to Use Cold Therapy Cold therapy, or cryotherapy, is a treatment that uses cold temperatures to treat an injury or medical condition. It includes using cold packs or ice packs to reduce pain and swelling. Only use cold therapy if your doctor says it is okay. What are the risks? Generally, cold therapy is a safe treatment. However, it is not safe for:  People who are not able to say they are in pain. These include small children and people who have memory problems.  People who have certain conditions, such as: ? A problem in the vessels that slows blood flow to the fingers and toes (Raynaud's syndrome). ? Feeling very cold easily (cold hypersensitivity). ? Lack of feeling in the area being iced. Cold therapy may not be safe for people who have other conditions. Do not use it without talking to your doctor if you have:  A heart condition.  High blood pressure.  Open or healing wounds.  An infection.  Pain and swelling in your joints (rheumatoid arthritis).  Poor blood flow in the body.  Diabetes.  Certain skin conditions. How can I make a cold pack? When using a cold pack at home to reduce pain and swelling, you can use:  A silica gel cold pack that has been left in the freezer. You can buy this online or in stores.  A sealable plastic bag that has been filled with crushed ice.  A washcloth or paper towels soaked in cold (or ice) water.  A plastic bag of frozen vegetables. Throw them away when you are finished using them as a cold pack. Supplies needed:  A cold pack.  A towel. This can be dry or damp, based on what you  like. How to use cold therapy 1. Have your cold pack ready. 2. Place a towel between the cold pack and your skin. You may also wrap the cold pack in a towel. 3. Put the cold pack on the affected area. Keep it on for no more than 20 minutes at a time. 4. Check your skin after 5 minutes to make sure that there is no damage to the area. Check for: ? White spots on your skin. Your skin may look blotchy or mottled. ? Skin that looks blue or pale. ? Skin that feels waxy or hard. 5. Repeat these steps as many times each day as told by your doctor. Always use a towel to avoid direct contact with your skin.   Contact a doctor if:  You start to have white spots on your skin. This may give your skin a blotchy or mottled look.  Your skin turns blue or pale.  Your skin becomes waxy or hard.  Your swelling gets worse. Summary  Cold therapy, or cryotherapy, is used to treat an injury or other conditions. It includes using cold packs or ice packs to reduce pain and swelling.  Cold therapy is not safe for people who are not able to say they are in pain.  When using cold packs or ice packs, always place a towel between the cold source and your skin.  Check your skin after 5 minutes of icing it. This is to make sure that there is no skin damage.  Contact your doctor if you notice changes in your skin or your swelling gets worse. This information is not intended to replace advice given to you by your health care provider. Make sure you discuss any questions you have with your health care provider. Document Revised: 12/09/2019 Document Reviewed: 12/22/2017 Elsevier Patient Education  2021 [employer].

## 2020-08-29 NOTE — Progress Notes (Signed)
.   Subjective:     Patient ID: Miranda Wallace, female   DOB: 03-17-1996, 25 y.o.   MRN: [id number]  HPI Miranda Wallace is a 25 y.o. female employed in the [employer]. She presents to the Clinic today with c/o right thumb pain. She reports that she slammed the car door shut with her thumb in the door. She c/o pain that she rates  /10 and minimal bleeding around the nail. She reports tingling in the thumb. She is UTD on tetanus.   Review of Systems  Musculoskeletal: Positive for joint swelling.  Skin: Positive for wound.  Neurological: Positive for numbness.  All other systems reviewed and are negative.     Objective: BP 131/84   Pulse 82   Temp 99.6 F (37.6 C)   Resp 16   LMP 08/06/2020     Physical Exam Constitutional:      Appearance: Normal appearance.  HENT:     Head: Normocephalic.     Nose: Nose normal.  Cardiovascular:     Rate and Rhythm: Normal rate.  Pulmonary:     Effort: Pulmonary effort is normal.  Musculoskeletal:        General: Swelling and tenderness present.     Right hand: Swelling and tenderness present. Normal range of motion. Normal sensation. Normal capillary refill. Normal pulse.       Hands:     Cervical back: Normal range of motion.     Comments: There is swelling and bleeding at the distal aspect of the right thumb surrounding the nail. There is bleeding under the nail.   Skin:    Findings: Signs of injury and wound present.  Neurological:     General: No focal deficit present.     Mental Status: She is alert.     Motor: No weakness.       Assessment/Plan:    1. Injury of nail bed of right thumb, initial encounter - DG Finger Thumb Right; Future  2. Subungual hematoma of left thumb, initial encounter Wound cleaned, bandaged and patient sent for x-ray.       4:07 pm DG Finger Thumb Right  Result Date: 08/29/2020 CLINICAL DATA:  Closed finger in car door earlier today. EXAM: RIGHT THUMB 2+V COMPARISON:  None. FINDINGS: No  fracture or dislocation. Sesamoid bones are noted about the MCP and IP joints of thumb. Joint spaces are preserved. No erosions. Regional soft tissues appear normal. No radiopaque foreign body. IMPRESSION: No fracture or radiopaque foreign body. Electronically Signed   By: Simonne Come M.D.   On: 08/29/2020 16:00   Discussed with the patient x-ray results and plan of care. Bacitracin ointment and dressing applied to wound. Patient will take ibuprofen, elevate the area and apply ice. She will return in 2 days for recheck or sooner if needed.

## 2020-08-31 ENCOUNTER — Ambulatory Visit: Payer: Self-pay | Admitting: Physician Assistant

## 2020-08-31 ENCOUNTER — Other Ambulatory Visit: Payer: Self-pay

## 2020-08-31 ENCOUNTER — Encounter: Payer: Self-pay | Admitting: Physician Assistant

## 2020-08-31 VITALS — BP 92/69 | HR 81 | Temp 97.8°F | Resp 14 | Ht 63.0 in | Wt 210.0 lb

## 2020-08-31 DIAGNOSIS — L089 Local infection of the skin and subcutaneous tissue, unspecified: Secondary | ICD-10-CM

## 2020-08-31 MED ORDER — SULFAMETHOXAZOLE-TRIMETHOPRIM 800-160 MG PO TABS: 1.0000 | ORAL_TABLET | Freq: Two times a day (BID) | ORAL | 0 refills | Status: DC

## 2020-08-31 MED ORDER — NAPROXEN 500 MG PO TABS: 500.0000 mg | ORAL_TABLET | Freq: Two times a day (BID) | ORAL | Status: DC

## 2020-08-31 NOTE — Progress Notes (Signed)
   Subjective: Right thumb infection    Patient ID: Miranda Wallace, female    DOB: [birth year], 25 y.o.   MRN: [id number]  HPI Patient presents with pain, swelling, and drainage from right thumb.  Patient was seen 2 days ago status post slamming her thumb in a car door.  X-rays unremarkable.  Patient stated rest 24 hours increased pain and drainage.  Patient denies loss sensation or loss of function.  Patient is aware x-rays negative for fracture.   Review of Systems     Negative septal complaint Objective:   Physical Exam  No acute distress.  Patient is right-hand dominant.  Edema, erythema and yellowish-greenish discharge from proximal nailbed of the right thumb.      Assessment & Plan: Skin infection  Patient given discharge care instructions and a prescription for Bactrim DS and naproxen.  Follow-up if worsening complaint.

## 2020-10-16 ENCOUNTER — Ambulatory Visit: Payer: Self-pay | Admitting: Physician Assistant

## 2020-10-16 ENCOUNTER — Other Ambulatory Visit: Payer: Self-pay

## 2020-10-16 ENCOUNTER — Encounter: Payer: Self-pay | Admitting: Physician Assistant

## 2020-10-16 VITALS — BP 127/88 | HR 80 | Temp 98.0°F | Resp 14 | Ht 64.0 in | Wt 200.0 lb

## 2020-10-16 DIAGNOSIS — L03021 Acute lymphangitis of right finger: Secondary | ICD-10-CM

## 2020-10-16 MED ORDER — NAPROXEN 500 MG PO TABS: 500.0000 mg | ORAL_TABLET | Freq: Two times a day (BID) | ORAL | 0 refills | Status: DC

## 2020-10-16 MED ORDER — SULFAMETHOXAZOLE-TRIMETHOPRIM 800-160 MG PO TABS: 1.0000 | ORAL_TABLET | Freq: Two times a day (BID) | ORAL | 0 refills | Status: DC

## 2020-10-16 MED ORDER — NEOSPORIN PLUS PAIN RELIEF MS 3.5-10000-10 EX CREA: TOPICAL_CREAM | Freq: Two times a day (BID) | CUTANEOUS | 0 refills | Status: DC

## 2020-10-16 NOTE — Progress Notes (Signed)
   Subjective: Right thumb infection    Patient ID: Miranda Wallace, female    DOB: 07-25-1995, 25 y.o.   MRN: [id number]  HPI Patient presents with swelling and redness to the distal right thumbnail.  Patient had a history of a right thumb contusion in the May 2022.  Patient admits to noncompliance of medical advice.  Patient denies loss sensation or loss of function.  Patient is in a slight discharge from the distal fingernail of the right thumb.  Rates pain as a 4/10.  Described pain as "sore".  No palliative measure for complaint.  Review of Systems Eczema and chief complaint.          Physical Exam  No acute distress.  Right thumb is edematous and erythematous.  Yellow-greenish discharge distal nailbed.      Assessment & Plan: [place] right thumb   Patient given discharge care instructions and a prescription for Bactrim DS, naproxen, and Neosporin.  Patient advised Epson salt salt 5 to 10 minutes daily for 3 days.  Patient advised to follow-up in 3 days if no improvement or worsening complaint.

## 2020-10-23 ENCOUNTER — Ambulatory Visit: Payer: 59

## 2020-10-23 ENCOUNTER — Emergency Department (INDEPENDENT_AMBULATORY_CARE_PROVIDER_SITE_OTHER)
Admission: EM | Admit: 2020-10-23 | Discharge: 2020-10-23 | Disposition: A | Discharge status: Home / Self Care | Payer: 59 | Source: Home / Self Care | Attending: Family Medicine | Admitting: Family Medicine

## 2020-10-23 ENCOUNTER — Encounter: Payer: Self-pay | Admitting: Emergency Medicine

## 2020-10-23 ENCOUNTER — Other Ambulatory Visit: Payer: Self-pay

## 2020-10-23 DIAGNOSIS — R110 Nausea: Secondary | ICD-10-CM

## 2020-10-23 DIAGNOSIS — R519 Headache, unspecified: Secondary | ICD-10-CM

## 2020-10-23 DIAGNOSIS — R0981 Nasal congestion: Secondary | ICD-10-CM | POA: Diagnosis not present

## 2020-10-23 MED ORDER — ONDANSETRON 4 MG PO TBDP
4.0000 mg | ORAL_TABLET | Freq: Once | ORAL | Status: AC
  Administered 2020-10-23: 4 mg via ORAL

## 2020-10-23 MED ORDER — KETOROLAC TROMETHAMINE 60 MG/2ML IM SOLN
60.0000 mg | Freq: Once | INTRAMUSCULAR | Status: AC
  Administered 2020-10-23: 60 mg via INTRAMUSCULAR

## 2020-10-23 MED ORDER — ONDANSETRON HCL 8 MG PO TABS: 8.0000 mg | ORAL_TABLET | Freq: Three times a day (TID) | ORAL | 0 refills | Status: DC | PRN

## 2020-10-23 MED ORDER — IBUPROFEN 600 MG PO TABS: 600.0000 mg | ORAL_TABLET | Freq: Four times a day (QID) | ORAL | 0 refills | Status: DC | PRN

## 2020-10-23 NOTE — Discharge Instructions (Signed)
May take ibuprofen 3 times a day with food.  Take as needed for headache May take Zofran 2 or 3 times a day as needed for nausea Home tomorrow to rest Call or return if not improving in a couple days Consider home COVID test if symptoms persist

## 2020-10-23 NOTE — ED Triage Notes (Signed)
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 Has had all covid vaccinations and boosters.

## 2020-10-23 NOTE — ED Triage Notes (Signed)
Patient states she is on bactrim for infection on right thumb.

## 2020-10-24 NOTE — ED Provider Notes (Signed)
[facility]    CSN: [id number] Arrival date & time: 10/23/20  1702      History   Chief Complaint Chief Complaint  Patient presents with   Nausea   Headache   Emesis   Nasal Congestion   Dizziness    HPI Miranda Wallace is a 25 y.o. female.   HPI  Patient is here for an upper respiratory infection.  She states she is on Bactrim for infection in her thumb.  She states she got home from her shift job last night's been having nausea vomiting lightheadedness nasal congestion and fever.  She has had all her COVID vaccines and boosters.  She states that at this time the vomiting and dizziness have improved.  Headache and nausea have persisted with mild nasal congestion and clear rhinorrhea.  She felt unable to go to work today and needs a note No history of migraine or headache condition  Past Medical History:  Diagnosis Date   Eczema    Seasonal allergies     Patient Active Problem List   Diagnosis Date Noted   Preventative health care 08/14/2020   Eczema 02/09/2019    Past Surgical History:  Procedure Laterality Date   CYST EXCISION Left 2015   left wrist    OB History     Gravida  0   Para  0   Term  0   Preterm  0   AB  0   Living  0      SAB  0   IAB  0   Ectopic  0   Multiple  0   Live Births  0            Home Medications    Prior to Admission medications   Medication Sig Start Date End Date Taking? Authorizing Provider  ibuprofen (ADVIL) 600 MG tablet Take 1 tablet (600 mg total) by mouth every 6 (six) hours as needed. 10/23/20  Yes Eustace Moore, MD  ondansetron (ZOFRAN) 8 MG tablet Take 1 tablet (8 mg total) by mouth every 8 (eight) hours as needed for nausea or vomiting. 10/23/20  Yes Eustace Moore, MD  norgestimate-ethinyl estradiol (ORTHO-CYCLEN) 0.25-35 MG-MCG tablet Take 1 tablet by mouth daily. 08/22/20   Rasch, Victorino Dike I, NP  sulfamethoxazole-trimethoprim (BACTRIM DS) 800-160 MG tablet Take 1 tablet  by mouth 2 (two) times daily. 10/16/20   Joni Reining, PA-C    Family History Family History  Problem Relation Age of Onset   Dementia Maternal Grandmother    Cancer Paternal Grandmother    Cancer Paternal Grandfather    Diabetes Maternal Aunt     Social History Social History   Tobacco Use   Smoking status: Never   Smokeless tobacco: Never  Vaping Use   Vaping Use: Never used  Substance Use Topics   Alcohol use: Yes    Alcohol/week: 2.0 standard drinks    Types: 2 Shots of liquor per week    Comment: once a month   Drug use: Not Currently     Allergies   Lisinopril   Review of Systems Review of Systems  See HPI Physical Exam Triage Vital Signs ED Triage Vitals  Enc Vitals Group     BP 10/23/20 1747 126/84     Pulse Rate 10/23/20 1747 97     Resp 10/23/20 1747 18     Temp 10/23/20 1747 98.7 F (37.1 C)     Temp Source 10/23/20 1747 Oral  SpO2 10/23/20 1747 99 %     Weight 10/23/20 1754 200 lb (90.7 kg)     Height 10/23/20 1754 5\' 4"  (1.626 m)     Head Circumference --      Peak Flow --      Pain Score 10/23/20 1753 0     Pain Loc --      Pain Edu? --      Excl. in GC? --    No data found.  Updated Vital Signs BP 126/84 (BP Location: Right Arm)   Pulse 97   Temp 98.7 F (37.1 C) (Oral)   Resp 18   Ht 5\' 4"  (1.626 m)   Wt 90.7 kg   LMP 10/14/2020 (Exact Date)   SpO2 99%   BMI 34.33 kg/m      Physical Exam Constitutional:      General: She is not in acute distress.    Appearance: She is well-developed.     Comments: Alert.  Pleasant.  No distress  HENT:     Head: Normocephalic and atraumatic.     Right Ear: Tympanic membrane normal.     Left Ear: Tympanic membrane normal.     Nose: Congestion and rhinorrhea present.     Mouth/Throat:     Mouth: Mucous membranes are moist.     Pharynx: No posterior oropharyngeal erythema.  Eyes:     Conjunctiva/sclera: Conjunctivae normal.     Pupils: Pupils are equal, round, and reactive to  light.     Comments: Disks are flat  Cardiovascular:     Rate and Rhythm: Normal rate and regular rhythm.     Heart sounds: Normal heart sounds.  Pulmonary:     Effort: Pulmonary effort is normal. No respiratory distress.     Breath sounds: Normal breath sounds.  Abdominal:     General: There is no distension.     Palpations: Abdomen is soft.  Musculoskeletal:        General: Normal range of motion.     Cervical back: Normal range of motion.  Lymphadenopathy:     Cervical: No cervical adenopathy.  Skin:    General: Skin is warm and dry.  Neurological:     General: No focal deficit present.     Mental Status: She is alert.  Psychiatric:        Mood and Affect: Mood normal.        Behavior: Behavior normal.     UC Treatments / Results  Labs (all labs ordered are listed, but only abnormal results are displayed) Labs Reviewed - No data to display  EKG   Radiology No results found.  Procedures Procedures (including critical care time)  Medications Ordered in UC Medications  ondansetron (ZOFRAN-ODT) disintegrating tablet 4 mg (4 mg Oral Given 10/23/20 1800)  ketorolac (TORADOL) injection 60 mg (60 mg Intramuscular Given 10/23/20 1913)    Initial Impression / Assessment and Plan / UC Course  I have reviewed the triage vital signs and the nursing notes.  Pertinent labs & imaging results that were available during my care of the patient were reviewed by me and considered in my medical decision making (see chart for details).     Patient has a viral syndrome that is causing her symptoms.  We will give her Toradol for her headache pain, and ibuprofen to take at home.  She is also given a prescription for Zofran in case the nausea persist.  Recommend COVID testing which patient states she will do  at home if her symptoms persist.  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  Does not wear mask  while at work Final Clinical Impressions(s) / UC Diagnoses   Final diagnoses:  Nasal congestion  Bad headache  Nausea without vomiting     Discharge Instructions      May take ibuprofen 3 times a day with food.  Take as needed for headache May take Zofran 2 or 3 times a day as needed for nausea Home tomorrow to rest Call or return if not improving in a couple days Consider home COVID test if symptoms persist   ED Prescriptions     Medication Sig Dispense Auth. Provider   ibuprofen (ADVIL) 600 MG tablet Take 1 tablet (600 mg total) by mouth every 6 (six) hours as needed. 30 tablet Eustace Moore, MD   ondansetron (ZOFRAN) 8 MG tablet Take 1 tablet (8 mg total) by mouth every 8 (eight) hours as needed for nausea or vomiting. 20 tablet Eustace Moore, MD      PDMP not reviewed this encounter.   Eustace Moore, MD 10/24/20 [phone]

## 2020-10-26 ENCOUNTER — Other Ambulatory Visit: Payer: Self-pay

## 2020-10-26 ENCOUNTER — Ambulatory Visit (INDEPENDENT_AMBULATORY_CARE_PROVIDER_SITE_OTHER): Payer: 59

## 2020-10-26 VITALS — BP 109/63 | HR 81 | Ht 64.0 in | Wt 217.0 lb

## 2020-10-26 DIAGNOSIS — Z23 Encounter for immunization: Secondary | ICD-10-CM | POA: Diagnosis not present

## 2020-10-26 NOTE — Progress Notes (Signed)
Chart reviewed - agree with CMA/RN documentation.  ° °

## 2020-10-26 NOTE — Progress Notes (Signed)
Miranda Wallace here for  HPV   Injection.  Injection administered without complication. Patient will return in  4 months  for next injection.  Sartaj Hoskin l Cyriah Childrey, CMA 10/26/2020  10:21 AM

## 2020-11-28 ENCOUNTER — Emergency Department (HOSPITAL_BASED_OUTPATIENT_CLINIC_OR_DEPARTMENT_OTHER)
Admission: EM | Admit: 2020-11-28 | Discharge: 2020-11-28 | Disposition: A | Discharge status: Home / Self Care | Payer: 59 | Attending: Emergency Medicine | Admitting: Emergency Medicine

## 2020-11-28 ENCOUNTER — Other Ambulatory Visit: Payer: Self-pay

## 2020-11-28 ENCOUNTER — Encounter (HOSPITAL_BASED_OUTPATIENT_CLINIC_OR_DEPARTMENT_OTHER): Payer: Self-pay | Admitting: *Deleted

## 2020-11-28 DIAGNOSIS — L03012 Cellulitis of left finger: Secondary | ICD-10-CM | POA: Diagnosis not present

## 2020-11-28 DIAGNOSIS — L089 Local infection of the skin and subcutaneous tissue, unspecified: Secondary | ICD-10-CM | POA: Diagnosis not present

## 2020-11-28 DIAGNOSIS — L03019 Cellulitis of unspecified finger: Secondary | ICD-10-CM | POA: Diagnosis not present

## 2020-11-28 MED ORDER — DOXYCYCLINE HYCLATE 100 MG PO CAPS: 100.0000 mg | ORAL_CAPSULE | Freq: Two times a day (BID) | ORAL | 0 refills | Status: DC

## 2020-11-28 NOTE — ED Triage Notes (Signed)
She slammed her right thumb in a car door a month ago. At that time her fingernail was lifted from the skin. Since then she has had problems with recurrent pus and drainage.

## 2020-11-28 NOTE — Discharge Instructions (Addendum)
Soak area 20 minutes 4 times a day.  Follow up with your Physicain if symptoms persist

## 2020-11-28 NOTE — ED Provider Notes (Signed)
[facility] EMERGENCY DEPARTMENT Provider Note   CSN: [id number] Arrival date & time: 11/28/20  1134     History Chief Complaint  Patient presents with   Finger Injury   Recurrent Skin Infections    Miranda Wallace is a 25 y.o. female.  Pt complains of swelling and pain to tip of finger.  Pt reports she took a antibiotic without relief   The history is provided by the patient. No language interpreter was used.      Past Medical History:  Diagnosis Date   Eczema    Seasonal allergies     Patient Active Problem List   Diagnosis Date Noted   Preventative health care 08/14/2020   Eczema 02/09/2019    Past Surgical History:  Procedure Laterality Date   CYST EXCISION Left 2015   left wrist     OB History     Gravida  0   Para  0   Term  0   Preterm  0   AB  0   Living  0      SAB  0   IAB  0   Ectopic  0   Multiple  0   Live Births  0           Family History  Problem Relation Age of Onset   Dementia Maternal Grandmother    Cancer Paternal Grandmother    Cancer Paternal Grandfather    Diabetes Maternal Aunt     Social History   Tobacco Use   Smoking status: Never   Smokeless tobacco: Never  Vaping Use   Vaping Use: Never used  Substance Use Topics   Alcohol use: Yes    Alcohol/week: 2.0 standard drinks    Types: 2 Shots of liquor per week    Comment: once a month   Drug use: Not Currently    Home Medications Prior to Admission medications   Medication Sig Start Date End Date Taking? Authorizing Provider  doxycycline (VIBRAMYCIN) 100 MG capsule Take 1 capsule (100 mg total) by mouth 2 (two) times daily. 11/28/20  Yes Elson Areas, PA-C  norgestimate-ethinyl estradiol (ORTHO-CYCLEN) 0.25-35 MG-MCG tablet Take 1 tablet by mouth daily. 08/22/20  Yes Rasch, Victorino Dike I, NP  ibuprofen (ADVIL) 600 MG tablet Take 1 tablet (600 mg total) by mouth every 6 (six) hours as needed. 10/23/20   Eustace Moore, MD   ondansetron (ZOFRAN) 8 MG tablet Take 1 tablet (8 mg total) by mouth every 8 (eight) hours as needed for nausea or vomiting. 10/23/20   Eustace Moore, MD  sulfamethoxazole-trimethoprim (BACTRIM DS) 800-160 MG tablet Take 1 tablet by mouth 2 (two) times daily. 10/16/20   Joni Reining, PA-C    Allergies    Lisinopril  Review of Systems   Review of Systems  All other systems reviewed and are negative.  Physical Exam Updated Vital Signs BP 108/76 (BP Location: Right Arm)   Pulse 82   Temp 98.1 F (36.7 C) (Oral)   Resp 16   Ht 5\' 4"  (1.626 m)   Wt 98.4 kg   LMP 11/10/2020   SpO2 98%   BMI 37.24 kg/m   Physical Exam Vitals reviewed.  Cardiovascular:     Rate and Rhythm: Normal rate.  Pulmonary:     Effort: Pulmonary effort is normal.  Musculoskeletal:        General: Tenderness present.     Comments: Swollen area distal finger, nv and ns intact  Skin:  General: Skin is warm.  Neurological:     General: No focal deficit present.     Mental Status: She is alert.  Psychiatric:        Mood and Affect: Mood normal.    ED Results / Procedures / Treatments   Labs (all labs ordered are listed, but only abnormal results are displayed) Labs Reviewed - No data to display  EKG None  Radiology No results found.  Procedures Procedures   Medications Ordered in ED Medications - No data to display  ED Course  I have reviewed the triage vital signs and the nursing notes.  Pertinent labs & imaging results that were available during my care of the patient were reviewed by me and considered in my medical decision making (see chart for details).    MDM Rules/Calculators/A&P                           MDM:  Pt advised to soak area 20 minutes 4 times a day.   Final Clinical Impression(s) / ED Diagnoses Final diagnoses:  Finger infection    Rx / DC Orders ED Discharge Orders          Ordered    doxycycline (VIBRAMYCIN) 100 MG capsule  2 times daily         11/28/20 1204          An After Visit Summary was printed and given to the patient.    Elson Areas, Cordelia Poche 11/28/20 1208    Virgina Norfolk, DO 11/28/20 1246

## 2021-01-30 ENCOUNTER — Other Ambulatory Visit: Payer: Self-pay

## 2021-01-30 ENCOUNTER — Emergency Department (INDEPENDENT_AMBULATORY_CARE_PROVIDER_SITE_OTHER)
Admission: EM | Admit: 2021-01-30 | Discharge: 2021-01-30 | Disposition: A | Discharge status: Home / Self Care | Payer: 59 | Source: Home / Self Care | Attending: Family Medicine | Admitting: Family Medicine

## 2021-01-30 DIAGNOSIS — M79672 Pain in left foot: Secondary | ICD-10-CM | POA: Diagnosis not present

## 2021-01-30 MED ORDER — METHYLPREDNISOLONE 4 MG PO TBPK: ORAL_TABLET | ORAL | 0 refills | Status: DC

## 2021-01-30 NOTE — Discharge Instructions (Addendum)
Try different shoes or a pad over the bump Take the medrol as directed See Dr Jordan Likes if problem persists

## 2021-01-30 NOTE — ED Triage Notes (Signed)
Pt c/o knot on LT ankle that she noticed 2 weeks ago. Denies injury. Painful when walking. Pain 7/10 when walking.

## 2021-01-30 NOTE — ED Provider Notes (Signed)
[facility]    CSN: [id number] Arrival date & time: 01/30/21  1228      History   Chief Complaint Chief Complaint  Patient presents with   Ankle Pain    LT, knot     HPI Miranda Wallace is a 25 y.o. female.   HPI  Patient has a painful bump on her left ankle.  Is been present for a couple weeks.  It is painful with weightbearing.  Is painful when she wears her work boots.  She does not recall any accident or injury.  Past Medical History:  Diagnosis Date   Eczema    Seasonal allergies     Patient Active Problem List   Diagnosis Date Noted   Preventative health care 08/14/2020   Eczema 02/09/2019    Past Surgical History:  Procedure Laterality Date   CYST EXCISION Left 2015   left wrist    OB History     Gravida  0   Para  0   Term  0   Preterm  0   AB  0   Living  0      SAB  0   IAB  0   Ectopic  0   Multiple  0   Live Births  0            Home Medications    Prior to Admission medications   Medication Sig Start Date End Date Taking? Authorizing Provider  methylPREDNISolone (MEDROL DOSEPAK) 4 MG TBPK tablet tad 01/30/21  Yes Eustace Moore, MD  ibuprofen (ADVIL) 600 MG tablet Take 1 tablet (600 mg total) by mouth every 6 (six) hours as needed. 10/23/20   Eustace Moore, MD  norgestimate-ethinyl estradiol (ORTHO-CYCLEN) 0.25-35 MG-MCG tablet Take 1 tablet by mouth daily. 08/22/20   Rasch, Harolyn Rutherford, NP  ondansetron (ZOFRAN) 8 MG tablet Take 1 tablet (8 mg total) by mouth every 8 (eight) hours as needed for nausea or vomiting. 10/23/20   Eustace Moore, MD    Family History Family History  Problem Relation Age of Onset   Dementia Maternal Grandmother    Cancer Paternal Grandmother    Cancer Paternal Grandfather    Diabetes Maternal Aunt     Social History Social History   Tobacco Use   Smoking status: Never   Smokeless tobacco: Never  Vaping Use   Vaping Use: Never used  Substance Use Topics    Alcohol use: Yes    Alcohol/week: 2.0 standard drinks    Types: 2 Shots of liquor per week    Comment: once a month   Drug use: Not Currently     Allergies   Lisinopril   Review of Systems Review of Systems See HPI  Physical Exam Triage Vital Signs ED Triage Vitals  Enc Vitals Group     BP 01/30/21 1304 117/80     Pulse Rate 01/30/21 1304 82     Resp 01/30/21 1304 17     Temp 01/30/21 1304 98.7 F (37.1 C)     Temp Source 01/30/21 1304 Oral     SpO2 01/30/21 1304 100 %     Weight --      Height --      Head Circumference --      Peak Flow --      Pain Score 01/30/21 1306 0     Pain Loc --      Pain Edu? --      Excl. in GC? --  No data found.  Updated Vital Signs BP 117/80 (BP Location: Right Arm)   Pulse 82   Temp 98.7 F (37.1 C) (Oral)   Resp 17   LMP 01/25/2021 (Exact Date)   SpO2 100%      Physical Exam Constitutional:      General: She is not in acute distress.    Appearance: She is well-developed.  HENT:     Head: Normocephalic and atraumatic.     Nose:     Comments: Mask is in place Eyes:     Conjunctiva/sclera: Conjunctivae normal.     Pupils: Pupils are equal, round, and reactive to light.  Cardiovascular:     Rate and Rhythm: Normal rate.  Pulmonary:     Effort: Pulmonary effort is normal. No respiratory distress.  Abdominal:     General: There is no distension.     Palpations: Abdomen is soft.  Musculoskeletal:        General: Normal range of motion.     Cervical back: Normal range of motion.       Feet:  Skin:    General: Skin is warm and dry.  Neurological:     Mental Status: She is alert.     UC Treatments / Results  Labs (all labs ordered are listed, but only abnormal results are displayed) Labs Reviewed - No data to display  EKG   Radiology No results found.  Procedures Procedures (including critical care time)  Medications Ordered in UC Medications - No data to display  Initial Impression /  Assessment and Plan / UC Course  I have reviewed the triage vital signs and the nursing notes.  Pertinent labs & imaging results that were available during my care of the patient were reviewed by me and considered in my medical decision making (see chart for details).     Reviewed that this is inflammation of the lower Achilles tendon, sometimes from rubbing.  Recommend a doughnut type of pad in her shoe.  May need to change her shoewear.  Ice and anti-inflammatories are prescribed.  Follow-up with sports medicine if fails to improve Final Clinical Impressions(s) / UC Diagnoses   Final diagnoses:  Pain of left heel     Discharge Instructions      Try different shoes or a pad over the bump Take the medrol as directed See Dr Jordan Likes if problem persists   ED Prescriptions     Medication Sig Dispense Auth. Provider   methylPREDNISolone (MEDROL DOSEPAK) 4 MG TBPK tablet tad 21 tablet Eustace Moore, MD      PDMP not reviewed this encounter.   Eustace Moore, MD 01/30/21 2027

## 2021-02-26 ENCOUNTER — Ambulatory Visit: Payer: 59

## 2021-06-03 ENCOUNTER — Emergency Department
Admission: EM | Admit: 2021-06-03 | Discharge: 2021-06-04 | Disposition: A | Discharge status: Home / Self Care | Payer: 59 | Attending: Emergency Medicine | Admitting: Emergency Medicine

## 2021-06-03 ENCOUNTER — Emergency Department: Payer: 59

## 2021-06-03 ENCOUNTER — Other Ambulatory Visit: Payer: Self-pay

## 2021-06-03 DIAGNOSIS — E876 Hypokalemia: Secondary | ICD-10-CM | POA: Diagnosis not present

## 2021-06-03 DIAGNOSIS — R0789 Other chest pain: Secondary | ICD-10-CM | POA: Diagnosis not present

## 2021-06-03 DIAGNOSIS — R079 Chest pain, unspecified: Secondary | ICD-10-CM | POA: Diagnosis not present

## 2021-06-03 DIAGNOSIS — R0602 Shortness of breath: Secondary | ICD-10-CM | POA: Diagnosis not present

## 2021-06-03 LAB — CBC
HCT: 36.3 % (ref 36.0–46.0)
Hemoglobin: 11.7 g/dL — ABNORMAL LOW (ref 12.0–15.0)
MCH: 28.1 pg (ref 26.0–34.0)
MCHC: 32.2 g/dL (ref 30.0–36.0)
MCV: 87.3 fL (ref 80.0–100.0)
Platelets: 327 10*3/uL (ref 150–400)
RBC: 4.16 MIL/uL (ref 3.87–5.11)
RDW: 13.1 % (ref 11.5–15.5)
WBC: 4.9 10*3/uL (ref 4.0–10.5)
nRBC: 0 % (ref 0.0–0.2)

## 2021-06-03 LAB — TROPONIN I (HIGH SENSITIVITY): Troponin I (High Sensitivity): <2 ng/L (ref <18)

## 2021-06-03 MED ORDER — POTASSIUM CHLORIDE CRYS ER 20 MEQ PO TBCR
40.0000 meq | EXTENDED_RELEASE_TABLET | Freq: Once | ORAL | Status: AC
  Administered 2021-06-04: 40 meq via ORAL
  Filled 2021-06-03: qty 2

## 2021-06-03 NOTE — ED Triage Notes (Signed)
Patient reports upper chest pain off/on tonight for approximately 10 minutes.  Reports similar episode that lasted 5 minutes last pm.

## 2021-06-03 NOTE — ED Provider Notes (Signed)
[facility] Provider Note    Event Date/Time   First MD Initiated Contact with Patient 06/03/21 2255     (approximate)   History   Chest Pain   HPI  Miranda Wallace is a 26 y.o. female who presents to the ED for evaluation of Chest Pain   Patient reports no significant medical history.  Takes no regular prescription medications.  No OCPs or estrogen supplementation.  Patient presents to the ED for evaluation of intermittent left-sided sharp chest pains in the past 2 days.  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  Denies concurrent symptoms such as dizziness, syncope, shortness of breath, emesis or diaphoresis.  Reports a handful of episodes similarly happening yesterday.  None with exertion.  Denies decreased exercise tolerance or dyspnea on exertion.  Reports doing upper body workout/exercises in the past week and been somewhat sore from this.  Reports feeling fine right now without chest pain, but concerned about her heart.   Physical Exam   Triage Vital Signs: ED Triage Vitals [06/03/21 2107]  Enc Vitals Group     BP 124/84     Pulse Rate 88     Resp 17     Temp 98.7 F (37.1 C)     Temp Source Oral     SpO2 97 %     Weight 200 lb (90.7 kg)     Height 5\' 4"  (1.626 m)     Head Circumference      Peak Flow      Pain Score 0     Pain Loc      Pain Edu?      Excl. in GC?     Most recent vital signs: Vitals:   06/03/21 2303 06/04/21 0030  BP: 122/72 (!) 139/112  Pulse: 71 89  Resp: 16 17  Temp:    SpO2: 100% 100%    General: Awake, no distress.  CV:  Good peripheral perfusion. RRR. No murmurs appreciated Resp:  Normal effort. CTAB Abd:  No distention. Soft and benign throughout MSK:  No deformity noted. No lower extremity swelling or TTP Neuro:  No focal deficits appreciated. Cranial nerves II through XII  intact 5/5 strength and sensation in all 4 extremities Other:     ED Results / Procedures / Treatments   Labs (all labs ordered are listed, but only abnormal results are displayed) Labs Reviewed  BASIC METABOLIC PANEL - Abnormal; Notable for the following components:      Result Value   Potassium 3.4 (*)    All other components within normal limits  CBC - Abnormal; Notable for the following components:   Hemoglobin 11.7 (*)    All other components within normal limits  D-DIMER, QUANTITATIVE  POC URINE PREG, ED  TROPONIN I (HIGH SENSITIVITY)  TROPONIN I (HIGH SENSITIVITY)    EKG Sinus rhythm, rate of 91 bpm.  Normal axis and intervals.  Tremulous baseline without evidence of acute ischemia.  RADIOLOGY CXR reviewed by me without evidence of acute cardiopulmonary pathology.  Official radiology report(s): DG Chest 2 View  Result Date: 06/03/2021 CLINICAL DATA:  Chest pain tonight. EXAM: CHEST - 2 VIEW COMPARISON:  None. FINDINGS: Heart size is normal. Mediastinal shadows are normal. The lungs are clear. No bronchial thickening. No infiltrate, mass, effusion or collapse. Pulmonary vascularity is normal. No bony abnormality. IMPRESSION: Normal chest Electronically Signed  By: Paulina Fusi M.D.   On: 06/03/2021 22:07    PROCEDURES and INTERVENTIONS:  .1-3 Lead EKG Interpretation Performed by: Delton Prairie, MD Authorized by: Delton Prairie, MD     Interpretation: normal     ECG rate:  70   ECG rate assessment: normal     Rhythm: sinus rhythm     Ectopy: none     Conduction: normal    Medications  potassium chloride SA (KLOR-CON M) CR tablet 40 mEq (has no administration in time range)     IMPRESSION / MDM / ASSESSMENT AND PLAN / ED COURSE  I reviewed the triage vital signs and the nursing notes.  26 year old low risk female presents to the ED with atypical chest pains, unlikely to represent cardiopulmonary pathology and likely MSK in etiology.  Normal vitals on room air.   Reassuring examination without evidence of trauma, neurologic or vascular deficits.  No cardiac murmurs.  She is PERC negative and unlikely to represent an acute PE.  She is tender to palpation, reproducing her symptoms and I suspect her symptoms are more MSK in etiology.  Troponins are negative, EKG is nonischemic and blood work is benign.  Marginal hypokalemia that we will replete orally and normal CBC.  Patient referred to cardiology for evaluations of her chest pains.  We discussed return precautions.  Clinical Course as of 06/04/21 0104  Sun Jun 03, 2021  2354 Reassessed and answered some questions. [DS]  Mon Jun 04, 2021  0008 Second troponin negative. [DS]  0024 Just prior to discharge, I am called to the room due to an episode of severe chest pain.  I immediately go to reevaluate the patient and she is tearful, clutching her chest and tachycardic with a sinus tach with rates in the 110s-120s.  Immediately repeat EKG that is similar, Once printed demonstrates a sinus rhythm with a rate of 85 bpm.  Normal axis and intervals.  Without ischemic features.  We discussed plan of care and shared decision-making goals plan to pursue D-dimer screening for the possibility of CTA chest.  While I'm in the room, she quickly calms down and HR normalizes [DS]  0103 D-dimer is negative.  I reassessed the patient and she is laughing with friends in the room and looks well.  No recurrence of tachycardia or pain.  We discussed following up with cardiology and return precautions for the ED. [DS]    Clinical Course User Index [DS] Delton Prairie, MD     FINAL CLINICAL IMPRESSION(S) / ED DIAGNOSES   Final diagnoses:  Other chest pain     Rx / DC Orders   ED Discharge Orders     None        Note:  This document was prepared using Dragon voice recognition software and may include unintentional dictation errors.   Delton Prairie, MD 06/04/21 [phone]

## 2021-06-04 LAB — TROPONIN I (HIGH SENSITIVITY): Troponin I (High Sensitivity): <2 ng/L (ref <18)

## 2021-06-04 LAB — D-DIMER, QUANTITATIVE: D-Dimer, Quant: 0.27 ug/mL-FEU (ref 0.00–0.50)

## 2021-06-04 NOTE — Discharge Instructions (Addendum)
Please take Tylenol and ibuprofen/Advil for your pain.  It is safe to take them together, or to alternate them every few hours.  Take up to 1000mg  of Tylenol at a time, up to 4 times per day.  Do not take more than 4000 mg of Tylenol in 24 hours.  For ibuprofen, take 400-600 mg, 4-5 times per day.  If you develop any significantly worsening pain, particularly pain with fever, pain with passing out then please return to the ER.

## 2021-06-05 ENCOUNTER — Emergency Department (INDEPENDENT_AMBULATORY_CARE_PROVIDER_SITE_OTHER)
Admission: EM | Admit: 2021-06-05 | Discharge: 2021-06-05 | Disposition: A | Discharge status: Home / Self Care | Payer: 59 | Source: Home / Self Care | Attending: Family Medicine | Admitting: Family Medicine

## 2021-06-05 ENCOUNTER — Other Ambulatory Visit: Payer: Self-pay

## 2021-06-05 ENCOUNTER — Encounter: Payer: Self-pay | Admitting: *Deleted

## 2021-06-05 DIAGNOSIS — R0789 Other chest pain: Secondary | ICD-10-CM | POA: Diagnosis not present

## 2021-06-05 LAB — BASIC METABOLIC PANEL
Anion gap: 10 (ref 5–15)
BUN: 9 mg/dL (ref 6–20)
CO2: 23 mmol/L (ref 22–32)
Calcium: 9.4 mg/dL (ref 8.9–10.3)
Chloride: 104 mmol/L (ref 98–111)
Creatinine, Ser: 0.73 mg/dL (ref 0.44–1.00)
GFR, Estimated: >60 mL/min (ref >60)
Glucose, Bld: 100 mg/dL — ABNORMAL HIGH (ref 70–99)
Potassium: 3.4 mmol/L — ABNORMAL LOW (ref 3.5–5.1)
Sodium: 137 mmol/L (ref 135–145)

## 2021-06-05 MED ORDER — NAPROXEN SODIUM 550 MG PO TABS: 550.0000 mg | ORAL_TABLET | Freq: Two times a day (BID) | ORAL | 0 refills | Status: DC

## 2021-06-05 NOTE — ED Triage Notes (Signed)
Pt c/o LT side and center of CP x 2 days only when she is sitting and leaning forward. Feels better when she lays flat or stands.  No OTC meds.

## 2021-06-05 NOTE — ED Provider Notes (Signed)
[facility]    CSN: [id number] Arrival date & time: 06/05/21  1339      History   Chief Complaint Chief Complaint  Patient presents with   Chest Pain    HPI Miranda Wallace is a 26 y.o. female.   HPI Patient was seen in the emergency room yesterday for chest pain.  She had negative EKG chest x-ray D-dimer and troponins.  Unfortunately, she did not receive a diagnosis to her satisfaction nor any treatment for her chest pain.  She is here for follow-up.  Her chest is still hurting.  She points to the anterior chest.  It hurts with movement and deep breath.  No exertional pain.  No radiation.  No dizziness.  No shortness of breath.  No underlying asthma or lung disease.  No cigarette smoking.  No GI symptoms or heartburn.  No recent cough or respiratory infection  Past Medical History:  Diagnosis Date   Eczema    Seasonal allergies     Patient Active Problem List   Diagnosis Date Noted   Preventative health care 08/14/2020   Eczema 02/09/2019    Past Surgical History:  Procedure Laterality Date   CYST EXCISION Left 2015   left wrist    OB History     Gravida  0   Para  0   Term  0   Preterm  0   AB  0   Living  0      SAB  0   IAB  0   Ectopic  0   Multiple  0   Live Births  0            Home Medications    Prior to Admission medications   Medication Sig Start Date End Date Taking? Authorizing Provider  Multiple Vitamin (MULTIVITAMIN) tablet Take 1 tablet by mouth daily.   Yes [provider]  naproxen sodium (ANAPROX DS) 550 MG tablet Take 1 tablet (550 mg total) by mouth 2 (two) times daily with a meal. 06/05/21  Yes Eustace Moore, MD    Family History Family History  Problem Relation Age of Onset   Dementia Maternal Grandmother    Cancer Paternal Grandmother    Cancer Paternal Grandfather    Diabetes Maternal Aunt     Social History Social History   Tobacco Use   Smoking status: Never   Smokeless  tobacco: Never  Vaping Use   Vaping Use: Never used  Substance Use Topics   Alcohol use: Yes    Alcohol/week: 2.0 standard drinks    Types: 2 Shots of liquor per week    Comment: once a month   Drug use: Not Currently     Allergies   Lisinopril   Review of Systems Review of Systems  See HPI Physical Exam Triage Vital Signs ED Triage Vitals  Enc Vitals Group     BP 06/05/21 1357 119/72     Pulse Rate 06/05/21 1357 93     Resp 06/05/21 1357 18     Temp 06/05/21 1357 98.5 F (36.9 C)     Temp Source 06/05/21 1357 Oral     SpO2 06/05/21 1357 97 %     Weight 06/05/21 1354 210 lb (95.3 kg)     Height 06/05/21 1354 5\' 4"  (1.626 m)     Head Circumference --      Peak Flow --      Pain Score 06/05/21 1354 1     Pain  Loc --      Pain Edu? --      Excl. in GC? --    No data found.  Updated Vital Signs BP 119/72 (BP Location: Left Arm)    Pulse 93    Temp 98.5 F (36.9 C) (Oral)    Resp 18    Ht 5\' 4"  (1.626 m)    Wt 95.3 kg    LMP 05/27/2021 (Approximate)    SpO2 97%    BMI 36.05 kg/m       Physical Exam Constitutional:      General: She is not in acute distress.    Appearance: She is well-developed.  HENT:     Head: Normocephalic and atraumatic.     Mouth/Throat:     Comments: Mask is in place Eyes:     Conjunctiva/sclera: Conjunctivae normal.     Pupils: Pupils are equal, round, and reactive to light.  Cardiovascular:     Rate and Rhythm: Normal rate and regular rhythm.     Heart sounds: Normal heart sounds.  Pulmonary:     Effort: Pulmonary effort is normal. No respiratory distress.     Breath sounds: Normal breath sounds.  Chest:     Chest wall: Tenderness present.    Abdominal:     General: Abdomen is flat. There is no distension.     Palpations: Abdomen is soft.     Tenderness: There is no abdominal tenderness.  Musculoskeletal:        General: Normal range of motion.     Cervical back: Normal range of motion and neck supple.  Skin:    General:  Skin is warm and dry.  Neurological:     Mental Status: She is alert.     UC Treatments / Results  Labs (all labs ordered are listed, but only abnormal results are displayed) Labs Reviewed - No data to display  EKG   Radiology DG Chest 2 View  Result Date: 06/03/2021 CLINICAL DATA:  Chest pain tonight. EXAM: CHEST - 2 VIEW COMPARISON:  None. FINDINGS: Heart size is normal. Mediastinal shadows are normal. The lungs are clear. No bronchial thickening. No infiltrate, mass, effusion or collapse. Pulmonary vascularity is normal. No bony abnormality. IMPRESSION: Normal chest Electronically Signed   By: Paulina Fusi M.D.   On: 06/03/2021 22:07    Procedures Procedures (including critical care time)  Medications Ordered in UC Medications - No data to display  Initial Impression / Assessment and Plan / UC Course  I have reviewed the triage vital signs and the nursing notes.  Pertinent labs & imaging results that were available during my care of the patient were reviewed by me and considered in my medical decision making (see chart for details).     Final Clinical Impressions(s) / UC Diagnoses   Final diagnoses:  Chest wall pain     Discharge Instructions      I believe your chest pain is caused by the ribs and cartilage of the chest wall Take Anaprox 2 times a day with food This is an anti-inflammatory pain medication Call your doctor if you do not improve in a few days   ED Prescriptions     Medication Sig Dispense Auth. Provider   naproxen sodium (ANAPROX DS) 550 MG tablet Take 1 tablet (550 mg total) by mouth 2 (two) times daily with a meal. 30 tablet Eustace Moore, MD      PDMP not reviewed this encounter.   Eustace Moore,  MD 06/05/21 1919

## 2021-06-05 NOTE — Discharge Instructions (Signed)
I believe your chest pain is caused by the ribs and cartilage of the chest wall Take Anaprox 2 times a day with food This is an anti-inflammatory pain medication Call your doctor if you do not improve in a few days

## 2021-08-15 ENCOUNTER — Encounter: Payer: 59 | Admitting: Family

## 2021-09-18 ENCOUNTER — Encounter: Payer: 59 | Admitting: Family

## 2021-09-18 ENCOUNTER — Encounter: Payer: Self-pay | Admitting: Family

## 2021-09-18 ENCOUNTER — Ambulatory Visit (INDEPENDENT_AMBULATORY_CARE_PROVIDER_SITE_OTHER): Payer: 59 | Admitting: Family

## 2021-09-18 VITALS — BP 119/58 | HR 98 | Temp 98.3°F | Resp 16 | Ht 64.0 in | Wt 231.0 lb

## 2021-09-18 DIAGNOSIS — D649 Anemia, unspecified: Secondary | ICD-10-CM | POA: Diagnosis not present

## 2021-09-18 DIAGNOSIS — R739 Hyperglycemia, unspecified: Secondary | ICD-10-CM | POA: Diagnosis not present

## 2021-09-18 DIAGNOSIS — Z0000 Encounter for general adult medical examination without abnormal findings: Secondary | ICD-10-CM

## 2021-09-18 DIAGNOSIS — R635 Abnormal weight gain: Secondary | ICD-10-CM | POA: Diagnosis not present

## 2021-09-18 LAB — COMPREHENSIVE METABOLIC PANEL
ALT: 12 U/L (ref 0–35)
AST: 10 U/L (ref 0–37)
Albumin: 3.8 g/dL (ref 3.5–5.2)
Alkaline Phosphatase: 30 U/L — ABNORMAL LOW (ref 39–117)
BUN: 8 mg/dL (ref 6–23)
CO2: 26 mEq/L (ref 19–32)
Calcium: 9.2 mg/dL (ref 8.4–10.5)
Chloride: 106 mEq/L (ref 96–112)
Creatinine, Ser: 0.73 mg/dL (ref 0.40–1.20)
GFR: 112.37 mL/min (ref >60.00)
Glucose, Bld: 82 mg/dL (ref 70–99)
Potassium: 4.4 mEq/L (ref 3.5–5.1)
Sodium: 137 mEq/L (ref 135–145)
Total Bilirubin: 0.6 mg/dL (ref 0.2–1.2)
Total Protein: 6.8 g/dL (ref 6.0–8.3)

## 2021-09-18 LAB — CBC WITH DIFFERENTIAL/PLATELET
Basophils Absolute: 0 10*3/uL (ref 0.0–0.1)
Basophils Relative: 0.4 % (ref 0.0–3.0)
Eosinophils Absolute: 0.1 10*3/uL (ref 0.0–0.7)
Eosinophils Relative: 3.2 % (ref 0.0–5.0)
HCT: 36.8 % (ref 36.0–46.0)
Hemoglobin: 11.8 g/dL — ABNORMAL LOW (ref 12.0–15.0)
Lymphocytes Relative: 40.8 % (ref 12.0–46.0)
Lymphs Abs: 1.8 10*3/uL (ref 0.7–4.0)
MCHC: 32 g/dL (ref 30.0–36.0)
MCV: 89.1 fl (ref 78.0–100.0)
Monocytes Absolute: 0.5 10*3/uL (ref 0.1–1.0)
Monocytes Relative: 10.7 % (ref 3.0–12.0)
Neutro Abs: 1.9 10*3/uL (ref 1.4–7.7)
Neutrophils Relative %: 44.9 % (ref 43.0–77.0)
Platelets: 312 10*3/uL (ref 150.0–400.0)
RBC: 4.13 Mil/uL (ref 3.87–5.11)
RDW: 14.7 % (ref 11.5–15.5)
WBC: 4.3 10*3/uL (ref 4.0–10.5)

## 2021-09-18 LAB — LIPID PANEL
Cholesterol: 89 mg/dL (ref 0–200)
HDL: 41.3 mg/dL (ref >39.00)
LDL Cholesterol: 42 mg/dL (ref 0–99)
NonHDL: 47.78
Total CHOL/HDL Ratio: 2
Triglycerides: 28 mg/dL (ref 0.0–149.0)
VLDL: 5.6 mg/dL (ref 0.0–40.0)

## 2021-09-18 LAB — FERRITIN: Ferritin: 6 ng/mL — ABNORMAL LOW (ref 10.0–291.0)

## 2021-09-18 LAB — HEMOGLOBIN A1C: Hgb A1c MFr Bld: 5.2 % (ref 4.6–6.5)

## 2021-09-18 LAB — IRON: Iron: 46 ug/dL (ref 42–145)

## 2021-09-18 LAB — TSH: TSH: 1 u[IU]/mL (ref 0.35–5.50)

## 2021-09-18 NOTE — Progress Notes (Signed)
Subjective:   By signing my name below, I, Carylon Perches, attest that this documentation has been prepared under the direction and in the presence of Debbrah Alar NP, 09/18/2021   Patient ID: Miranda Wallace, female    DOB: October 05, 1995, 26 y.o.   MRN: [id number]  Chief Complaint  Patient presents with   Annual Exam    HPI Patient is in today for a comprehensive physical exam.   Weight - Her weight is increasing. She wakes up at 12:00 and reports that her first meal of the day is typically at 13:00. She usually eats cereal or grits and bacons. She sometimes brings lunch to her work.  Wt Readings from Last 3 Encounters:  09/18/21 231 lb (104.8 kg)  06/05/21 210 lb (95.3 kg)  06/03/21 200 lb (90.7 kg)   Birth Control - She is currently not on birth control pills and is using condoms. She plans to go to her OBGYN to get a replacement for her birth control pills since her previous pills were causing her symptoms.  Iron - She states that she heavily menstruates the first 2 to 3 days of her cycle.  Work Note - She is requesting a signature for her work note.   She denies having any fever, ear pain, new muscle pain, joint pain, new moles, rashes, congestion, sinus pain, sore throat, palpations, wheezing, n/v/d, constipation, blood in stool, dysuria, frequency, hematuria, headaches, depresssion or anxiety at this time.  Social History: She reports no recent surgeries. She reports that her paternal cousin has been diagnosed with breast cancer, she is 28 years old. She also reports that her mother was diagnosed gout in her hands.  Pap Smear: Last completed on 08/22/2020 Immunizations: She is UTD on the tetanus vaccine. She has not received the Bivalent Covid 19 vaccine. Diet: She works night shifts and sometimes does not have time to eat. She works 5 days out of the week.  Exercise: She is regularly exercising. She goes to the gym and uses the treadmill and weights.  Dental: She is not  UTD on dental exams.  Vision: She is UTD on vision exams.   Health Maintenance Due  Topic Date Due   COVID-19 Vaccine (3 - Moderna series) 10/09/2020   HPV VACCINES (3 - 3-dose series) 02/26/2021    Past Medical History:  Diagnosis Date   Eczema    Seasonal allergies     Past Surgical History:  Procedure Laterality Date   CYST EXCISION Left 2015   left wrist    Family History  Problem Relation Age of Onset   Gout Mother    Dementia Maternal Grandmother    Cancer Paternal Grandmother    Cancer Paternal Grandfather    Diabetes Maternal Aunt    Breast cancer Cousin        paternal, age 82    Social History   Socioeconomic History   Marital status: Single    Spouse name: Not on file   Number of children: Not on file   Years of education: Not on file   Highest education level: Not on file  Occupational History   Occupation: unemployed  Tobacco Use   Smoking status: Never   Smokeless tobacco: Never  Vaping Use   Vaping Use: Never used  Substance and Sexual Activity   Alcohol use: Yes    Alcohol/week: 2.0 standard drinks of alcohol    Types: 2 Shots of liquor per week    Comment: once a month  Drug use: Not Currently   Sexual activity: Yes    Partners: Male    Birth control/protection: Condom  Other Topics Concern   Not on file  Social History Narrative   Graduated from [employer]- wants to work at the police department. Has degree in psychology   Lives with her best friend    No pets   Enjoys reading, spending time with family and friend   Social Determinants of Health   Financial Resource Strain: Low Risk  (02/09/2019)   Overall Financial Resource Strain (CARDIA)    Difficulty of Paying Living Expenses: Not hard at all  Food Insecurity: No Food Insecurity (02/09/2019)   Hunger Vital Sign    Worried About Running Out of Food in the Last Year: Never true    [place] in the Last Year: Never true  Transportation Needs: No Transportation Needs  (02/09/2019)   PRAPARE - Hydrologist (Medical): No    Lack of Transportation (Non-Medical): No  Physical Activity: Sufficiently Active (02/09/2019)   Exercise Vital Sign    Days of Exercise per Week: 4 days    Minutes of Exercise per Session: 60 min  Stress: Not on file  Social Connections: Unknown (02/09/2019)   Social Connection and Isolation Panel [NHANES]    Frequency of Communication with Friends and Family: More than three times a week    Frequency of Social Gatherings with Friends and Family: Once a week    Attends Religious Services: Never    Marine scientist or Organizations: No    Attends Archivist Meetings: Never    Marital Status: Not on file  Intimate Partner Violence: Not on file    Outpatient Medications Prior to Visit  Medication Sig Dispense Refill   Multiple Vitamin (MULTIVITAMIN) tablet Take 1 tablet by mouth daily.     naproxen sodium (ANAPROX DS) 550 MG tablet Take 1 tablet (550 mg total) by mouth 2 (two) times daily with a meal. 30 tablet 0   No facility-administered medications prior to visit.    Allergies  Allergen Reactions   Lisinopril Other (See Comments)    Unknown reaction.  Patient states cannot take    Review of Systems  Constitutional:  Negative for fever.  HENT:  Negative for congestion, ear pain, sinus pain and sore throat.   Respiratory:  Negative for wheezing.   Cardiovascular:  Negative for palpitations.  Gastrointestinal:  Negative for blood in stool, constipation, diarrhea, nausea and vomiting.  Genitourinary:  Negative for dysuria, frequency and hematuria.  Musculoskeletal:  Negative for joint pain and myalgias.  Skin:  Negative for rash.       (-) New Moles   Neurological:  Negative for headaches.  Psychiatric/Behavioral:  Negative for depression. The patient is not nervous/anxious.        Objective:    Physical Exam Constitutional:      General: She is not in acute distress.     Appearance: Normal appearance. She is not ill-appearing.  HENT:     Head: Normocephalic and atraumatic.     Right Ear: Tympanic membrane, ear canal and external ear normal.     Left Ear: Tympanic membrane, ear canal and external ear normal.  Eyes:     Extraocular Movements: Extraocular movements intact.     Pupils: Pupils are equal, round, and reactive to light.  Cardiovascular:     Rate and Rhythm: Normal rate and regular rhythm.     Heart  sounds: Normal heart sounds. No murmur heard.    No gallop.  Pulmonary:     Effort: Pulmonary effort is normal. No respiratory distress.     Breath sounds: Normal breath sounds. No wheezing or rales.  Abdominal:     General: Bowel sounds are normal. There is no distension.     Palpations: Abdomen is soft.     Tenderness: There is no abdominal tenderness. There is no guarding.  Musculoskeletal:     Comments: 5/5 strength in both upper and lower extremities  Skin:    General: Skin is warm and dry.  Neurological:     Mental Status: She is alert and oriented to person, place, and time.     Deep Tendon Reflexes:     Reflex Scores:      Patellar reflexes are 2+ on the right side and 2+ on the left side. Psychiatric:        Mood and Affect: Mood normal.        Behavior: Behavior normal.        Judgment: Judgment normal.     BP (!) 119/58 (BP Location: Left Arm, Patient Position: Sitting, Cuff Size: Large)   Pulse 98   Temp 98.3 F (36.8 C) (Oral)   Resp 16   Ht 5' 4"  (1.626 m)   Wt 231 lb (104.8 kg)   SpO2 100%   BMI 39.65 kg/m  Wt Readings from Last 3 Encounters:  09/18/21 231 lb (104.8 kg)  06/05/21 210 lb (95.3 kg)  06/03/21 200 lb (90.7 kg)       Assessment & Plan:   Problem List Items Addressed This Visit       Unprioritized   Weight gain    Likely related to change in work schedule. Check TSh.       Relevant Orders   TSH   Preventative health care - Primary    Wt Readings from Last 3 Encounters:  09/18/21 231 lb  (104.8 kg)  06/05/21 210 lb (95.3 kg)  06/03/21 200 lb (90.7 kg)  Discussed diet/exercise/weight loss.  Labs as below. Pap up to date. Recommended bivalent covid booster.       Morbid obesity ([place])   Relevant Orders   Lipid panel   Hyperglycemia    Noted on labs from ED. Check A1C.      Relevant Orders   Hemoglobin A1c   Comp Met (CMET)   Anemia    Lab Results  Component Value Date   WBC 4.9 06/03/2021   HGB 11.7 (L) 06/03/2021   HCT 36.3 06/03/2021   MCV 87.3 06/03/2021   PLT 327 06/03/2021  Check follow up cbc, iron levels. Not on supplement.       Relevant Orders   CBC with Differential/Platelet   Iron   Ferritin   No orders of the defined types were placed in this encounter.   I, Nance Pear, NP, personally preformed the services described in this documentation.  All medical record entries made by the scribe were at my direction and in my presence.  I have reviewed the chart and discharge instructions (if applicable) and agree that the record reflects my personal performance and is accurate and complete. 09/18/2021   I,Amber Collins,acting as a Education administrator for Nance Pear, NP.,have documented all relevant documentation on the behalf of Nance Pear, NP,as directed by  Nance Pear, NP while in the presence of Nance Pear, NP.  Nance Pear, NP

## 2021-09-18 NOTE — Assessment & Plan Note (Signed)
Likely related to change in work schedule. Check TSh.

## 2021-09-18 NOTE — Assessment & Plan Note (Signed)
Noted on labs from ED. Check A1C.

## 2021-09-18 NOTE — Assessment & Plan Note (Signed)
Wt Readings from Last 3 Encounters:  09/18/21 231 lb (104.8 kg)  06/05/21 210 lb (95.3 kg)  06/03/21 200 lb (90.7 kg)   Discussed diet/exercise/weight loss.  Labs as below. Pap up to date. Recommended bivalent covid booster.

## 2021-09-18 NOTE — Assessment & Plan Note (Signed)
Lab Results  Component Value Date   WBC 4.9 06/03/2021   HGB 11.7 (L) 06/03/2021   HCT 36.3 06/03/2021   MCV 87.3 06/03/2021   PLT 327 06/03/2021   Check follow up cbc, iron levels. Not on supplement.

## 2021-09-19 ENCOUNTER — Telehealth: Payer: Self-pay | Admitting: Family

## 2021-09-19 DIAGNOSIS — D509 Iron deficiency anemia, unspecified: Secondary | ICD-10-CM | POA: Insufficient documentation

## 2021-09-19 MED ORDER — IRON (FERROUS SULFATE) 325 (65 FE) MG PO TABS: 325.0000 mg | ORAL_TABLET | ORAL | Status: AC

## 2021-09-19 NOTE — Telephone Encounter (Signed)
Iron level is low. Please add iron 325mg  one tablet every other day. Repeat cbc, serum iron, ferritin in 12 weeks, dx iron deficiency anemia.

## 2021-09-20 NOTE — Telephone Encounter (Signed)
Called but no answer, lvm for patient to call back 

## 2021-09-20 NOTE — Telephone Encounter (Signed)
Patient was advised of results, new medication and scheduled to return in 3 months for labs.

## 2021-09-20 NOTE — Addendum Note (Signed)
Addended by: Wilford Corner on: 09/20/2021 12:39 PM   Modules accepted: Orders

## 2021-12-24 ENCOUNTER — Other Ambulatory Visit: Payer: 59

## 2022-04-22 IMAGING — CR DG FINGER THUMB 2+V*R*
1 series · 3 of 3 positions shown · non-contrast
Comparison: None.

CLINICAL DATA: Closed finger in car door earlier today.

EXAM:
RIGHT THUMB 2+V

[Series 1: dg finger thumb right · 0.14mm/px · 3 of 3 slices shown]
[im 1/3]
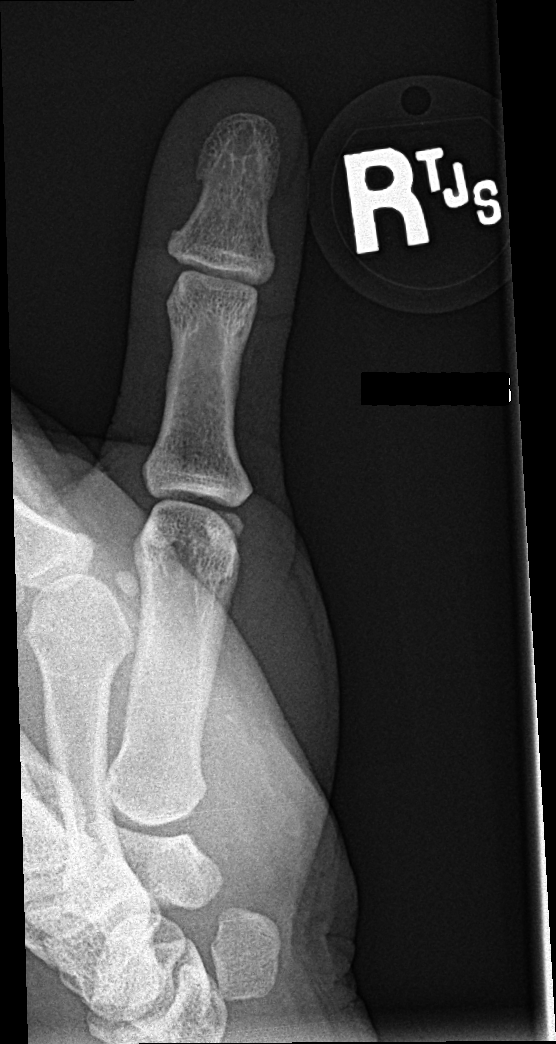
[im 2/3]
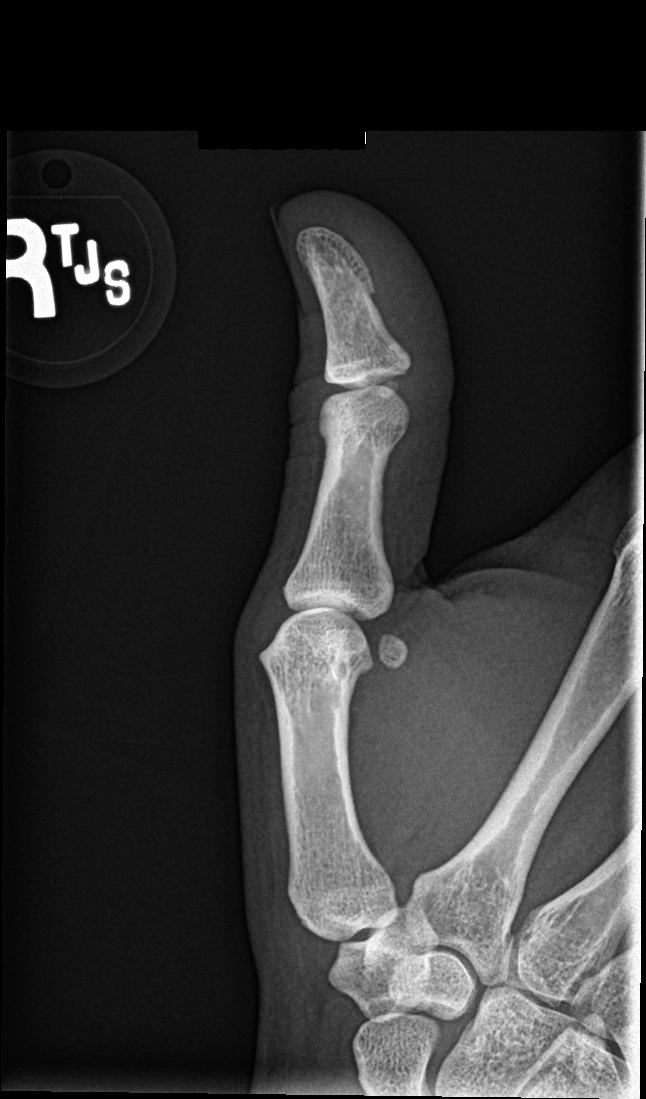
[im 3/3]
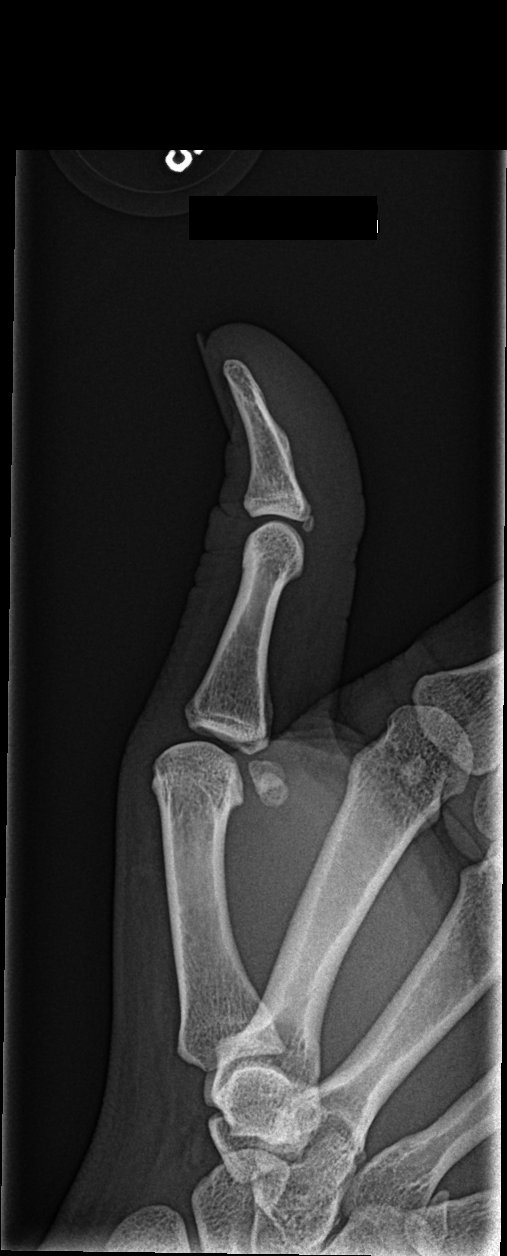

[3 of 3 positions shown; findings below may reference images not displayed]

FINDINGS: No fracture or dislocation. Sesamoid bones are noted about the MCP
and IP joints of thumb. Joint spaces are preserved. No erosions.
Regional soft tissues appear normal. No radiopaque foreign body.
IMPRESSION: No fracture or radiopaque foreign body.

## 2022-09-13 ENCOUNTER — Other Ambulatory Visit (HOSPITAL_COMMUNITY)
Admission: RE | Admit: 2022-09-13 | Discharge: 2022-09-13 | Disposition: A | Discharge status: Home / Self Care | Payer: 59 | Source: Ambulatory Visit | Attending: Obstetrics and Gynecology | Admitting: Obstetrics and Gynecology

## 2022-09-13 ENCOUNTER — Encounter: Payer: Self-pay | Admitting: Obstetrics and Gynecology

## 2022-09-13 ENCOUNTER — Ambulatory Visit (INDEPENDENT_AMBULATORY_CARE_PROVIDER_SITE_OTHER): Payer: 59 | Admitting: Obstetrics and Gynecology

## 2022-09-13 VITALS — BP 110/66 | HR 84 | Ht 64.0 in | Wt 230.0 lb

## 2022-09-13 DIAGNOSIS — Z1339 Encounter for screening examination for other mental health and behavioral disorders: Secondary | ICD-10-CM

## 2022-09-13 DIAGNOSIS — Z01419 Encounter for gynecological examination (general) (routine) without abnormal findings: Secondary | ICD-10-CM

## 2022-09-13 NOTE — Progress Notes (Signed)
ANNUAL EXAM Patient name: Brenyn Balchunas MRN [id number]  Date of birth: 1995/06/15 Chief Complaint:   Gynecologic Exam  History of Present Illness:   Annalou Lamberson is a 27 y.o. G0P0000 being seen today for a routine annual exam.  Current complaints: annual well woman  Menstrual concerns? No   Breast or nipple changes? No  Contraception use? Yes - condoms Sexually active? Yes    Patient's last menstrual period was 09/03/2022 (exact date).   The pregnancy intention screening data noted above was reviewed. Potential methods of contraception were discussed. The patient elected to proceed with No data recorded.   Last pap     Component Value Date/Time   DIAGPAP  08/22/2020 0948    - Negative for intraepithelial lesion or malignancy (NILM)   ADEQPAP  08/22/2020 0948    Satisfactory for evaluation; transformation zone component ABSENT.     H/O abnormal pap: no Last mammogram: n/a Last colonoscopy: n/a.      09/18/2021   11:14 AM 08/14/2020   10:06 AM  Depression screen PHQ 2/9  Decreased Interest 0 0  Down, Depressed, Hopeless 0 0  PHQ - 2 Score 0 0        09/13/2022    9:28 AM  GAD 7 : Generalized Anxiety Score  Nervous, Anxious, on Edge 0  Control/stop worrying 0  Worry too much - different things 0  Trouble relaxing 0  Restless 0  Easily annoyed or irritable 0  Afraid - awful might happen 0  Total GAD 7 Score 0     Review of Systems:   Pertinent items are noted in HPI Denies any headaches, blurred vision, fatigue, shortness of breath, chest pain, abdominal pain, abnormal vaginal discharge/itching/odor/irritation, problems with periods, bowel movements, urination, or intercourse unless otherwise stated above. Pertinent History Reviewed:  Reviewed past medical,surgical, social and family history.  Reviewed problem list, medications and allergies. Physical Assessment:   Vitals:   09/13/22 0919  BP: 110/66  Pulse: 84  Weight: 230 lb (104.3 kg)   Height: 5\' 4"  (1.626 m)  Body mass index is 39.48 kg/m.        Physical Examination:   General appearance - well appearing, and in no distress  Mental status - alert, oriented to person, place, and time  Psych:  She has a normal mood and affect  Skin - warm and dry, normal color, no suspicious lesions noted  Chest - effort normal, all lung fields clear to auscultation bilaterally  Heart - normal rate and regular rhythm  Breasts - breasts appear normal, no suspicious masses, no skin or nipple changes or  axillary nodes  Abdomen - soft, nontender, nondistended, no masses or organomegaly  Pelvic -  VULVA: normal appearing vulva with no masses, tenderness or lesions   VAGINA: normal appearing vagina with normal color and discharge, no lesions   CERVIX: no CMT  UTERUS: uterus is felt to be normal size, shape, consistency and nontender thought limited due to discomfort with single digit exam    ADNEXA: No adnexal masses or tenderness noted.  Extremities:  No swelling or varicosities noted  Chaperone present for exam  No results found for this or any previous visit (from the past 24 hour(s)).    Assessment & Plan:  1. Well woman exam with routine gynecological exam - Cervical cancer screening: Discussed screening Q3 years. Reviewed importance of annual exams and limits of pap smear. Pap with reflex HPV due 2025 - GC/CT: Discussed and recommended. Pt  accepts - Gardasil: completed - Birth Control: Condoms - Breast Health: Encouraged self breast awareness/exams.  - Follow-up: 12 months and prn   Orders Placed This Encounter  Procedures   RPR+HBsAg+HCVAb+...    Meds: No orders of the defined types were placed in this encounter.   Follow-up: Return if symptoms worsen or fail to improve, for Annual GYN.  Lorriane Shire, MD 09/13/2022 9:26 AM

## 2022-09-14 LAB — RPR+HBSAG+HCVAB+...
HIV Screen 4th Generation wRfx: NONREACTIVE
Hep C Virus Ab: NONREACTIVE
Hepatitis B Surface Ag: NEGATIVE
RPR Ser Ql: NONREACTIVE

## 2022-09-16 ENCOUNTER — Other Ambulatory Visit: Payer: Self-pay | Admitting: Obstetrics and Gynecology

## 2022-09-16 DIAGNOSIS — A599 Trichomoniasis, unspecified: Secondary | ICD-10-CM

## 2022-09-16 LAB — CERVICOVAGINAL ANCILLARY ONLY
Chlamydia: NEGATIVE
Comment: NEGATIVE
Comment: NEGATIVE
Comment: NORMAL
Neisseria Gonorrhea: NEGATIVE
Trichomonas: POSITIVE — AB

## 2022-09-16 MED ORDER — METRONIDAZOLE 500 MG PO TABS: 500.0000 mg | ORAL_TABLET | Freq: Two times a day (BID) | ORAL | 0 refills | Status: AC

## 2023-03-31 DIAGNOSIS — S8392XA Sprain of unspecified site of left knee, initial encounter: Secondary | ICD-10-CM | POA: Diagnosis not present

## 2023-04-07 DIAGNOSIS — S8392XA Sprain of unspecified site of left knee, initial encounter: Secondary | ICD-10-CM | POA: Diagnosis not present

## 2023-04-08 ENCOUNTER — Ambulatory Visit: Payer: Self-pay | Admitting: Physician Assistant

## 2023-04-08 ENCOUNTER — Encounter: Payer: Self-pay | Admitting: Physician Assistant

## 2023-04-08 DIAGNOSIS — Z7689 Persons encountering health services in other specified circumstances: Secondary | ICD-10-CM

## 2023-04-08 NOTE — Progress Notes (Signed)
   Subjective: Return to work evaluation    Patient ID: Miranda Wallace, female    DOB: June 22, 1995, 27 y.o.   MRN: [id number]  HPI Patient relates from urgent care secondary to knee pain.  Recommendation the patient return back to full duties on 04/11/2023.  Patient is to maintain current restrictions until 04/11/2023.   Review of Systems Negative septa above complaint    Objective:   Physical Exam Physical exam deferred       Assessment & Plan: Return to work   .  Discharged instruction from urgent care patient return back to work with no restrictions on 04/11/2023.

## 2023-09-10 DIAGNOSIS — Z6839 Body mass index (BMI) 39.0-39.9, adult: Secondary | ICD-10-CM | POA: Diagnosis not present

## 2023-09-10 DIAGNOSIS — Z1331 Encounter for screening for depression: Secondary | ICD-10-CM | POA: Diagnosis not present

## 2023-09-10 DIAGNOSIS — Z113 Encounter for screening for infections with a predominantly sexual mode of transmission: Secondary | ICD-10-CM | POA: Diagnosis not present

## 2023-09-10 DIAGNOSIS — Z7689 Persons encountering health services in other specified circumstances: Secondary | ICD-10-CM | POA: Diagnosis not present

## 2023-09-10 DIAGNOSIS — E66812 Obesity, class 2: Secondary | ICD-10-CM | POA: Diagnosis not present

## 2023-09-10 DIAGNOSIS — E6609 Other obesity due to excess calories: Secondary | ICD-10-CM | POA: Diagnosis not present

## 2023-09-10 DIAGNOSIS — Z124 Encounter for screening for malignant neoplasm of cervix: Secondary | ICD-10-CM | POA: Diagnosis not present

## 2023-09-10 DIAGNOSIS — Z3041 Encounter for surveillance of contraceptive pills: Secondary | ICD-10-CM | POA: Diagnosis not present

## 2023-09-10 DIAGNOSIS — E611 Iron deficiency: Secondary | ICD-10-CM | POA: Diagnosis not present

## 2023-09-10 DIAGNOSIS — Z0000 Encounter for general adult medical examination without abnormal findings: Secondary | ICD-10-CM | POA: Diagnosis not present

## 2023-10-18 ENCOUNTER — Other Ambulatory Visit: Payer: Self-pay

## 2023-10-18 ENCOUNTER — Emergency Department
Admission: EM | Admit: 2023-10-18 | Discharge: 2023-10-18 | Disposition: A | Discharge status: Home / Self Care | Attending: Emergency Medicine | Admitting: Emergency Medicine

## 2023-10-18 DIAGNOSIS — Z23 Encounter for immunization: Secondary | ICD-10-CM | POA: Diagnosis not present

## 2023-10-18 DIAGNOSIS — S51852A Open bite of left forearm, initial encounter: Secondary | ICD-10-CM | POA: Diagnosis present

## 2023-10-18 DIAGNOSIS — W540XXA Bitten by dog, initial encounter: Secondary | ICD-10-CM | POA: Insufficient documentation

## 2023-10-18 MED ORDER — TETANUS-DIPHTH-ACELL PERTUSSIS 5-2.5-18.5 LF-MCG/0.5 IM SUSY
0.5000 mL | PREFILLED_SYRINGE | Freq: Once | INTRAMUSCULAR | Status: AC
  Administered 2023-10-18: 0.5 mL via INTRAMUSCULAR
  Filled 2023-10-18: qty 0.5

## 2023-10-18 MED ORDER — AMOXICILLIN-POT CLAVULANATE 875-125 MG PO TABS: 1.0000 | ORAL_TABLET | Freq: Two times a day (BID) | ORAL | 0 refills | Status: AC

## 2023-10-18 NOTE — ED Provider Notes (Signed)
 [facility] Provider Note    Event Date/Time   First MD Initiated Contact with Patient 10/18/23 1518     (approximate)   History   Animal Bite   HPI  Miranda Wallace is a 28 y.o. female who presents today for evaluation of dog bite to her left forearm.  Patient reports that this happened approximately 40 minutes before arrival by a [place].  She reports that the dog's vaccines are fairly up-to-date.  Her partner who is with her said that they already notified animal control.  Patient denies numbness or tingling.  She is unsure exactly when her last tetanus shot was.  Patient Active Problem List   Diagnosis Date Noted   Iron  deficiency anemia 09/19/2021   Morbid obesity (HCC) 09/18/2021   Anemia 09/18/2021   Hyperglycemia 09/18/2021   Weight gain 09/18/2021   Preventative health care 08/14/2020   Eczema 02/09/2019          Physical Exam   Triage Vital Signs: ED Triage Vitals [10/18/23 1511]  Encounter Vitals Group     BP (!) 145/120     Girls Systolic BP Percentile      Girls Diastolic BP Percentile      Boys Systolic BP Percentile      Boys Diastolic BP Percentile      Pulse Rate (!) 111     Resp 20     Temp 97.9 F (36.6 C)     Temp Source Oral     SpO2 98 %     Weight 210 lb (95.3 kg)     Height 5' 4 (1.626 m)     Head Circumference      Peak Flow      Pain Score 5     Pain Loc      Pain Education      Exclude from Growth Chart     Most recent vital signs: Vitals:   10/18/23 1617 10/18/23 1617  BP: 136/88   Pulse:  81  Resp:    Temp:    SpO2:      Physical Exam Vitals and nursing note reviewed.  Constitutional:      General: Awake and alert. No acute distress.    Appearance: Normal appearance. The patient is normal weight.  HENT:     Head: Normocephalic and atraumatic.     Mouth: Mucous membranes are moist.  Eyes:     General: PERRL. Normal EOMs        Right eye: No discharge.        Left eye: No  discharge.     Conjunctiva/sclera: Conjunctivae normal.  Cardiovascular:     Rate and Rhythm: Normal rate and regular rhythm.     Pulses: Normal pulses.  Pulmonary:     Effort: Pulmonary effort is normal. No respiratory distress.     Breath sounds: Normal breath sounds.  Abdominal:     Abdomen is soft. There is no abdominal tenderness. No rebound or guarding. No distention. Musculoskeletal:        General: No swelling. Normal range of motion.     Cervical back: Normal range of motion and neck supple.  Left forearm with 0.25 cm superficial laceration to her dorsal forearm, involving the skin only.  There is a punctate wound on the volar aspect of the forearm.  No induration.  Scant bleeding.  Full and normal range of motion of wrist, and all fingers.  Normal radial pulse. Skin:  General: Skin is warm and dry.     Capillary Refill: Capillary refill takes less than 2 seconds.     Findings: No rash.  Neurological:     Mental Status: The patient is awake and alert.      ED Results / Procedures / Treatments   Labs (all labs ordered are listed, but only abnormal results are displayed) Labs Reviewed - No data to display   EKG     RADIOLOGY     PROCEDURES:  Critical Care performed:   Procedures   MEDICATIONS ORDERED IN ED: Medications  Tdap (BOOSTRIX) injection 0.5 mL (0.5 mLs Intramuscular Given 10/18/23 1615)     IMPRESSION / MDM / ASSESSMENT AND PLAN / ED COURSE  I reviewed the triage vital signs and the nursing notes.   Differential diagnosis includes, but is not limited to, dog bite, puncture wound, abrasion, contusion, fracture, retained foreign body.  Patient is awake and alert, hemodynamically stable and afebrile.  She has normal and full range of motion of her wrist and all fingers, I do not suspect tendon injury.  Her wound is very small both the dorsum and volar aspect of her forearm.  There is no bleeding, no induration.  Full depth of wound is able to  be visualized, no retained foreign body.  I did recommend that she obtain an x-ray for further evaluation for any retained foreign body or bony injury, however patient declined.  Her wound was irrigated and cleaned, and bandaged.  We discussed no closure of this wound given the increased risk of infection and patient is in agreement.  She was given an updated tetanus shot.  Her partner with her is already notified animal control.  She reports that she does not need rabies vaccinations given that the dog is up-to-date on its rabies vaccination.  She was started on Augmentin .  We discussed wound care and return precautions.  Patient understands agrees with plan.  Discharged in stable condition.   Patient's presentation is most consistent with acute complicated illness / injury requiring diagnostic workup.    FINAL CLINICAL IMPRESSION(S) / ED DIAGNOSES   Final diagnoses:  Dog bite, initial encounter     Rx / DC Orders   ED Discharge Orders          Ordered    amoxicillin -clavulanate (AUGMENTIN ) 875-125 MG tablet  2 times daily        10/18/23 1551             Note:  This document was prepared using Dragon voice recognition software and may include unintentional dictation errors.   Tyshun Tuckerman E, PA-C 10/18/23 1654    Dicky Anes, MD 11/01/23 [phone]

## 2023-10-18 NOTE — ED Triage Notes (Addendum)
 Pt to ED from work. Pt sustained dog bite to left arm while on duty. Dog is up to date on shots. Pt has two puncture wounds. This will be workers comp

## 2023-10-18 NOTE — ED Notes (Signed)
 Per PD pt does not need and ETOH/Drug screen

## 2023-10-18 NOTE — Discharge Instructions (Signed)
 Your tetanus was updated.  Please take the antibiotics as prescribed.  Please return for any new, worsening, or change in symptoms or other concerns.  It was a pleasure caring for you today.

## 2023-10-27 ENCOUNTER — Other Ambulatory Visit: Payer: Self-pay

## 2023-10-27 NOTE — Progress Notes (Signed)
 Pt completed random UDS and ETOH.  EOTH is cleared.  UDS pending with [facility]  Specimen ID #:[id number]

## 2024-04-04 DIAGNOSIS — J101 Influenza due to other identified influenza virus with other respiratory manifestations: Secondary | ICD-10-CM | POA: Diagnosis not present

## 2024-04-04 DIAGNOSIS — R52 Pain, unspecified: Secondary | ICD-10-CM | POA: Diagnosis not present

## 2024-04-04 DIAGNOSIS — R6883 Chills (without fever): Secondary | ICD-10-CM | POA: Diagnosis not present

## 2024-04-04 DIAGNOSIS — B9689 Other specified bacterial agents as the cause of diseases classified elsewhere: Secondary | ICD-10-CM | POA: Diagnosis not present

## 2024-04-04 DIAGNOSIS — R509 Fever, unspecified: Secondary | ICD-10-CM | POA: Diagnosis not present

## 2024-04-04 DIAGNOSIS — J029 Acute pharyngitis, unspecified: Secondary | ICD-10-CM | POA: Diagnosis not present

## 2024-04-04 DIAGNOSIS — J028 Acute pharyngitis due to other specified organisms: Secondary | ICD-10-CM | POA: Diagnosis not present

## 2024-04-04 DIAGNOSIS — R059 Cough, unspecified: Secondary | ICD-10-CM | POA: Diagnosis not present
